# Patient Record
Sex: Female | Born: 1975 | Race: White | Hispanic: No | Smoking: Current some day smoker
Health system: Southern US, Community
[De-identification: ages and names within clinical notes are randomized; demographics above are authoritative.]

## PROBLEM LIST (undated history)

## (undated) DIAGNOSIS — A5901 Trichomonal vulvovaginitis: Secondary | ICD-10-CM

## (undated) DIAGNOSIS — J22 Unspecified acute lower respiratory infection: Secondary | ICD-10-CM

## (undated) DIAGNOSIS — N731 Chronic parametritis and pelvic cellulitis: Secondary | ICD-10-CM

## (undated) DIAGNOSIS — A749 Chlamydial infection, unspecified: Secondary | ICD-10-CM

## (undated) DIAGNOSIS — L0390 Cellulitis, unspecified: Secondary | ICD-10-CM

## (undated) HISTORY — DX: Chronic parametritis and pelvic cellulitis: N73.1

---
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
## 2003-11-11 ENCOUNTER — Emergency Department: Payer: Self-pay | Admitting: Emergency Medicine

## 2004-03-06 ENCOUNTER — Emergency Department: Payer: Self-pay | Admitting: Emergency Medicine

## 2004-12-12 ENCOUNTER — Emergency Department: Payer: Self-pay | Admitting: Emergency Medicine

## 2005-03-09 ENCOUNTER — Emergency Department: Payer: Self-pay | Admitting: Emergency Medicine

## 2005-04-14 ENCOUNTER — Emergency Department: Payer: Self-pay | Admitting: General Practice

## 2006-03-21 ENCOUNTER — Emergency Department: Payer: Self-pay | Admitting: Internal Medicine

## 2007-04-13 ENCOUNTER — Emergency Department: Payer: Self-pay | Admitting: Emergency Medicine

## 2007-09-08 ENCOUNTER — Emergency Department (HOSPITAL_COMMUNITY): Admission: EM | Admit: 2007-09-08 | Discharge: 2007-09-08 | Payer: Self-pay | Admitting: Emergency Medicine

## 2007-09-09 ENCOUNTER — Emergency Department: Payer: Self-pay | Admitting: Emergency Medicine

## 2008-01-09 ENCOUNTER — Emergency Department (HOSPITAL_COMMUNITY): Admission: EM | Admit: 2008-01-09 | Discharge: 2008-01-09 | Payer: Self-pay | Admitting: Emergency Medicine

## 2010-07-01 ENCOUNTER — Emergency Department: Payer: Self-pay | Admitting: Internal Medicine

## 2010-10-16 LAB — URINALYSIS, ROUTINE W REFLEX MICROSCOPIC
Bilirubin Urine: NEGATIVE
Ketones, ur: NEGATIVE mg/dL
Nitrite: NEGATIVE
Urobilinogen, UA: 0.2 mg/dL (ref 0.0–1.0)

## 2010-10-16 LAB — WET PREP, GENITAL: Trich, Wet Prep: NONE SEEN

## 2010-10-16 LAB — PREGNANCY, URINE: Preg Test, Ur: NEGATIVE

## 2010-10-16 LAB — GC/CHLAMYDIA PROBE AMP, GENITAL: Chlamydia, DNA Probe: NEGATIVE

## 2010-10-16 LAB — URINE MICROSCOPIC-ADD ON

## 2012-09-21 ENCOUNTER — Emergency Department: Payer: Self-pay | Admitting: Internal Medicine

## 2014-12-11 ENCOUNTER — Encounter: Payer: Self-pay | Admitting: *Deleted

## 2014-12-11 ENCOUNTER — Emergency Department
Admission: EM | Admit: 2014-12-11 | Discharge: 2014-12-11 | Disposition: A | Discharge status: Home / Self Care | Payer: BLUE CROSS/BLUE SHIELD | Attending: Emergency Medicine | Admitting: Emergency Medicine

## 2014-12-11 DIAGNOSIS — L03116 Cellulitis of left lower limb: Secondary | ICD-10-CM | POA: Insufficient documentation

## 2014-12-11 DIAGNOSIS — F17200 Nicotine dependence, unspecified, uncomplicated: Secondary | ICD-10-CM | POA: Diagnosis not present

## 2014-12-11 DIAGNOSIS — M79672 Pain in left foot: Secondary | ICD-10-CM | POA: Diagnosis present

## 2014-12-11 MED ORDER — SULFAMETHOXAZOLE-TRIMETHOPRIM 800-160 MG PO TABS: 1.0000 | ORAL_TABLET | Freq: Two times a day (BID) | ORAL | Status: DC

## 2014-12-11 NOTE — ED Provider Notes (Signed)
[facility] Emergency Department Provider Note ____________________________________________  Time seen: 1429  I have reviewed the triage vital signs and the nursing notes.  HISTORY  Chief Complaint  Foot Pain  HPI Julie Cooley is a 39 y.o. female reports to the ED for evaluation of pain to the left foot without known injury. She describes pain to the plantar surface between the third and fourth toes, at the balls of the toes. She denies any known injury, infection, laceration, or skin irritation. She's been dosing ibuprofen without significant tenderness symptoms.  History reviewed. No pertinent past medical history.  There are no active problems to display for this patient.  History reviewed. No pertinent past surgical history.  Current Outpatient Rx  Name  Route  Sig  Dispense  Refill  . sulfamethoxazole-trimethoprim (BACTRIM DS,SEPTRA DS) 800-160 MG tablet   Oral   Take 1 tablet by mouth 2 (two) times daily.   20 tablet   0     Allergies Review of patient's allergies indicates no known allergies.  No family history on file.  Social History Social History  Substance Use Topics  . Smoking status: Current Some Day Smoker  . Smokeless tobacco: None  . Alcohol Use: No   Review of Systems  Constitutional: Negative for fever. Eyes: Negative for visual changes. ENT: Negative for sore throat. Cardiovascular: Negative for chest pain. Respiratory: Negative for shortness of breath. Gastrointestinal: Negative for abdominal pain, vomiting and diarrhea. Genitourinary: Negative for dysuria. Musculoskeletal: Negative for back pain. Skin: Negative for rash. Left foot redness as above. Neurological: Negative for headaches, focal weakness or numbness. ____________________________________________  PHYSICAL EXAM:  VITAL SIGNS: ED Triage Vitals  Enc Vitals Group     BP 12/11/14 1407 143/64 mmHg     Pulse Rate 12/11/14 1407 84     Resp 12/11/14 1407  20     Temp 12/11/14 1407 98 F (36.7 C)     Temp Source 12/11/14 1407 Oral     SpO2 12/11/14 1407 97 %     Weight 12/11/14 1407 140 lb (63.504 kg)     Height 12/11/14 1407 5\' 4"  (1.626 m)     Head Cir --      Peak Flow --      Pain Score 12/11/14 1404 7     Pain Loc --      Pain Edu? --      Excl. in [place]? --    Constitutional: Alert and oriented. Well appearing and in no distress. Head: Normocephalic and atraumatic.      Eyes: Conjunctivae are normal. PERRL. Normal extraocular movements      Ears: Canals clear. TMs intact bilaterally.   Nose: No congestion/rhinorrhea.   Mouth/Throat: Mucous membranes are moist.   Neck: Supple. No thyromegaly. Hematological/Lymphatic/Immunological: No cervical lymphadenopathy. Cardiovascular: Normal rate, regular rhythm.  Respiratory: Normal respiratory effort. No wheezes/rales/rhonchi. Gastrointestinal: Soft and nontender. No distention. Musculoskeletal: Nontender with normal range of motion in all extremities.  Neurologic:  Normal gait without ataxia. Normal speech and language. No gross focal neurologic deficits are appreciated. Skin:  Skin is warm, dry and intact. No rash noted. Left foot with plantar surface erythema and edema to the MTPs of the 3rd & 4th digits. No fluctuance, pointing, or central lesion noted.  Psychiatric: Mood and affect are normal. Patient exhibits appropriate insight and judgment. ____________________________________________  INITIAL IMPRESSION / ASSESSMENT AND PLAN / ED COURSE  Left plantar surface cellulitis. Will treat with Bactrim DS as directed. Patient is  encouraged to soak the foot in warm water and epsom salt. Rest with the foot elevated and follow-up with Dr. Elvina Mattes for wound check. Return to the ED for worsening symptoms. ____________________________________________  FINAL CLINICAL IMPRESSION(S) / ED DIAGNOSES  Final diagnoses:  Cellulitis of foot, left      Melvenia Needles,  PA-C 12/11/14 1641  Daymon Larsen, MD 12/13/14 2306

## 2014-12-11 NOTE — Progress Notes (Signed)
Pt c/o left foot pain times several days. She denies any injury. Pt appears to have a small bump under her left fourth toe which is painful to the touch. Pt does have a positive pedal pulse.

## 2014-12-11 NOTE — ED Notes (Signed)
Pt reports right foot pain, pt denies injury to foot

## 2014-12-11 NOTE — Discharge Instructions (Signed)
Cellulitis Cellulitis is an infection of the skin and the tissue beneath it. The infected area is usually red and tender. Cellulitis occurs most often in the arms and lower legs.  CAUSES  Cellulitis is caused by bacteria that enter the skin through cracks or cuts in the skin. The most common types of bacteria that cause cellulitis are staphylococci and streptococci. SIGNS AND SYMPTOMS   Redness and warmth.  Swelling.  Tenderness or pain.  Fever. DIAGNOSIS  Your health care provider can usually determine what is wrong based on a physical exam. Blood tests may also be done. TREATMENT  Treatment usually involves taking an antibiotic medicine. HOME CARE INSTRUCTIONS   Take your antibiotic medicine as directed by your health care provider. Finish the antibiotic even if you start to feel better.  Keep the infected arm or leg elevated to reduce swelling.  Apply a warm cloth to the affected area up to 4 times per day to relieve pain.  Take medicines only as directed by your health care provider.  Keep all follow-up visits as directed by your health care provider. SEEK MEDICAL CARE IF:   You notice red streaks coming from the infected area.  Your red area gets larger or turns dark in color.  Your bone or joint underneath the infected area becomes painful after the skin has healed.  Your infection returns in the same area or another area.  You notice a swollen bump in the infected area.  You develop new symptoms.  You have a fever. SEEK IMMEDIATE MEDICAL CARE IF:   You feel very sleepy.  You develop vomiting or diarrhea.  You have a general ill feeling (malaise) with muscle aches and pains.   This information is not intended to replace advice given to you by your health care provider. Make sure you discuss any questions you have with your health care provider.   Document Released: 10/07/2004 Document Revised: 09/18/2014 Document Reviewed: 03/15/2011 Elsevier Interactive  Patient Education 2016 [place] the prescription antibiotic as directed until completely gone. Follow-up with Dr. Elvina Mattes for wound check and follow-up. Rest with the foot elevated when seated. Soak in warm, epsom salt water as needed.

## 2014-12-15 ENCOUNTER — Emergency Department (HOSPITAL_COMMUNITY)
Admission: EM | Admit: 2014-12-15 | Discharge: 2014-12-15 | Disposition: A | Discharge status: Home / Self Care | Payer: BLUE CROSS/BLUE SHIELD | Attending: Physician Assistant | Admitting: Physician Assistant

## 2014-12-15 ENCOUNTER — Encounter (HOSPITAL_COMMUNITY): Payer: Self-pay | Admitting: Emergency Medicine

## 2014-12-15 DIAGNOSIS — Z792 Long term (current) use of antibiotics: Secondary | ICD-10-CM | POA: Diagnosis not present

## 2014-12-15 DIAGNOSIS — L0291 Cutaneous abscess, unspecified: Secondary | ICD-10-CM

## 2014-12-15 DIAGNOSIS — R05 Cough: Secondary | ICD-10-CM | POA: Diagnosis not present

## 2014-12-15 DIAGNOSIS — F17200 Nicotine dependence, unspecified, uncomplicated: Secondary | ICD-10-CM | POA: Insufficient documentation

## 2014-12-15 DIAGNOSIS — L02612 Cutaneous abscess of left foot: Secondary | ICD-10-CM | POA: Diagnosis not present

## 2014-12-15 DIAGNOSIS — R509 Fever, unspecified: Secondary | ICD-10-CM | POA: Diagnosis present

## 2014-12-15 DIAGNOSIS — R110 Nausea: Secondary | ICD-10-CM | POA: Diagnosis not present

## 2014-12-15 MED ORDER — HYDROCODONE-ACETAMINOPHEN 5-325 MG PO TABS
1.0000 | ORAL_TABLET | Freq: Once | ORAL | Status: AC
Start: 2014-12-15 — End: 2014-12-15
  Administered 2014-12-15: 1 via ORAL
  Filled 2014-12-15: qty 1

## 2014-12-15 MED ORDER — HYDROCODONE-ACETAMINOPHEN 5-325 MG PO TABS: 2.0000 | ORAL_TABLET | ORAL | Status: DC | PRN

## 2014-12-15 MED ORDER — ONDANSETRON 4 MG PO TBDP
4.0000 mg | ORAL_TABLET | Freq: Three times a day (TID) | ORAL | Status: DC | PRN
Start: 2014-12-15 — End: 2016-12-09

## 2014-12-15 NOTE — ED Notes (Signed)
Pt. Stated, i went to [facility] for cellulitis in her left foot given Bactrim and started getting sick on Thursday with chills fever nausea

## 2014-12-15 NOTE — Discharge Instructions (Signed)
Take your medications as prescribed as needed. Keep area clean, with soap and water, and dry. Follow up at your scheduled appointment next week with wound care. Please return to the Emergency Department if symptoms worsen or new onset of fever, redness, swelling, warmth, drainage, numbness, tingling, weakness.   Emergency Department Resource Guide 1) Find a Doctor and Pay Out of Pocket Although you won't have to find out who is covered by your insurance plan, it is a good idea to ask around and get recommendations. You will then need to call the office and see if the doctor you have chosen will accept you as a new patient and what types of options they offer for patients who are self-pay. Some doctors offer discounts or will set up payment plans for their patients who do not have insurance, but you will need to ask so you aren't surprised when you get to your appointment.  2) Contact Your Local Health Department Not all health departments have doctors that can see patients for sick visits, but many do, so it is worth a call to see if yours does. If you don't know where your local health department is, you can check in your phone book. The CDC also has a tool to help you locate your state's health department, and many state websites also have listings of all of their local health departments.  3) Find a [facility] If your illness is not likely to be very severe or complicated, you may want to try a walk in clinic. These are popping up all over the country in pharmacies, drugstores, and shopping centers. They're usually staffed by nurse practitioners or physician assistants that have been trained to treat common illnesses and complaints. They're usually fairly quick and inexpensive. However, if you have serious medical issues or chronic medical problems, these are probably not your best option.  No Primary Care Doctor: - Call Health Connect at  [phone] - they can help you locate a primary care  doctor that  accepts your insurance, provides certain services, etc. - Physician Referral Service- [phone]  Chronic Pain Problems: Organization         Address  Phone   Notes  [facility]  [phone] Patients need to be referred by their primary care doctor.   Medication Assistance: Organization         Address  Phone   Notes  [facility] [place] [facility] Medication [facility] [address] [place], [place] [address] [phone] --Must be a resident of [facility] -- Must have NO insurance coverage whatsoever (no Medicaid/ Medicare, etc.) -- The pt. MUST have a primary care doctor that directs their care regularly and follows them in the community   Med[facility]ist  [phone]   [employer]  [phone]    Agencies that provide inexpensive medical care: Organization         Address  Phone   Notes  [place]  [phone]   [facility]    [phone]   [facility] [place], [place] [address] [phone]   [place] [address], [place] [phone]   Planned Parenthood    [phone]   [facility]    [phone]   [place] and [address], [place] Phone:  [phone], Fax:  [phone] Hours of Operation:  9 am -  6 pm, M-F.  Also accepts Medicaid/Medicare and self-pay.  [facility] for [place], [address], [place] Phone: [phone], Fax: [phone]. Hours of Operation:  8:30 am - 5:30 pm, M-F.  Also accepts Medicaid and self-pay.  [facility] [place] [address] [place] Phone: [phone]   [place], [place], [place] [phone], Ext. [phone] Mondays & Thursdays: 7-9 AM.  First 15 patients are seen on a first come, first serve basis.    [place]  Providers:  Organization         Address  Phone   Notes  [facility] [address],  [phone] Also accepts self-pay patients.  [facility] [id number] [place], [place]  [phone]   [place], [address], [place] [phone]   [facility] [address], [place] [phone]   Lucianne Lei [address], [place]   [phone] Only accepts [place] Access [place] patients after they have their name applied to their card.   Self-Pay (no insurance) in [facility]:  Organization         Address  Phone   Notes  Sickle Cell Patients, [facility] [place] [phone]   [facility] [phone]   [facility] [place]  [place], [place], [place] [phone]   [facility]/Dr. Osei-Bonsu  [address], [place] or [address], [place] [phone] Phone number for both [place] and [place] locations is the same.  [facility] and [facility] [address] [place] [phone]   [facility] [address] [place] or [address] [phone]   [facility] [address] [place] [phone], phone; [phone], fax Sees patients 1st and 3rd Saturday of every month.  Must not qualify for public or private insurance (i.e. Medicaid, Medicare, [place] Health Choice, Veterans' Benefits)  Household income should be no more than 200% of the poverty level The clinic cannot treat you if you are pregnant or think you are pregnant  Sexually transmitted diseases are not treated at the clinic.    Dental Care: Organization         Address  Phone  Notes  [facility] Department of [facility] [phone] Accepts children up to age 12 who are enrolled in [place] or [place]; pregnant women with a Medicaid card; and children who have applied for Medicaid or [place] Health Choice, but were declined, whose parents can pay a reduced fee at time of service.  [facility] Department of [facility]  [address], [place] [phone] Accepts children up to age 3 who are enrolled in [place] or [place]; pregnant women with a Medicaid card; and children who have applied for Medicaid or [place] Health Choice, but were declined, whose parents can pay a reduced fee at time of service.  [facility] Access PROGRAM  [place] [phone] Patients are seen by appointment only. Walk-ins are not accepted. [place] will see patients 73 years of age and  older. Monday - Tuesday (8am-5pm) Most Wednesdays (8:30-5pm) $30 per visit, cash only  [facility] Adult [employer] PROGRAM  [address], [facility] [phone] Patients are seen by appointment only. Walk-ins are not accepted. [place] will see patients 36 years of age and older. One Wednesday Evening (Monthly: Volunteer Based).  $30 per visit, cash only  [place]  [phone] for adults; Children under age 34, call Graduate Pediatric Dentistry at [phone]. Children aged 22-14, please call [phone] to request a pediatric application.  Dental services are provided in all areas of dental care including fillings, crowns and bridges, complete and partial dentures, implants, gum treatment, root canals, and extractions. Preventive care is also provided. Treatment is provided to both adults and children. Patients are selected via a lottery and there is often a waiting list.   [facility] [address] [place]  [phone] [email or web address]   [facility]  [address] [place], [place] [phone], Ext. [phone] Second and Fourth Thursday of each month, opens at 6:30 AM; Clinic ends at 9 AM.  Patients are seen on a first-come first-served basis, and a limited number are seen during each clinic.   [facility]  [address] Hillard Danker [place], [place] [phone]   Eligibility Requirements You must have lived in [place], [place], or [place] counties for at least the last three months.   You cannot be eligible for state or federal sponsored [employer], including [employer], [place], or [employer].   You generally cannot be eligible for healthcare insurance through your employer.    How to apply: Eligibility screenings are held every Tuesday and Wednesday afternoon from 1:00 pm until 4:00 pm. You do not need an appointment for the interview!  [facility] [address] [place], [place]   [place]  [place] Department  [place]  [phone]    Behavioral Health Resources in the Community: Intensive Outpatient Programs Organization         Address  Phone  Notes  [place]. [address], [place], [place] [phone]   [facility]patient [address] [place], [place]   ADS: Alcohol & Drug Svcs [address], [place], [place]   [place] [address],  [place], [place] or [phone]   Substance Abuse Resources Organization         Address  Phone  Notes  Alcohol and Drug Services  [phone]   [place]  [phone]   [facility] [place]   Chinita Pester  [phone]   Residential & Outpatient Substance Abuse Program  [phone]   Psychological Services Organization         Address  Phone  Notes  [facility]  [place]  [phone]   [place] [address] [place] [phone] or [phone]    Mobile Crisis Teams Organization         Address  Phone  Notes  [facility], Mobile Crisis Care Unit  [phone]   [facility]ive Psychotherapeutic Services  [address] [place], [place]   [facility] [address], [place] [phone]    Self-Help/Support Groups Organization         Address  Phone             Notes  Mental Health Assoc. of [place] - variety of support groups  [place] Call for more information  Narcotics Anonymous (NA), [facility] [address], [employer] [place]  2 meetings at this location   Special educational needs teacher         Address  Phone  Notes  [facility],    [place]  [phone]-([phone]   [facility]  [address], [place] [id number], [place], [place]   Agency [place], [place] [phone] Admissions: 8am-3pm M-F  Incentives Substance [place] [address],    [place], [place] [phone]   [facility] [address] [place], [place], [place]   [facility] [address]  [place], [place]   [facility]ight Programs - Intensive Outpatient [address] Kristeen [address], [place], [place]   [facility] ([place] [address].,  [place], [place] [phone] or [phone]   [facility]idential Treatment Services (RTS) [address], [place], [place] Accepts Medicaid  [facility] [place] [address],  [place] [phone] Substance Abuse/Addiction Treatment   [facility] Organization         Address  Phone  Notes  [facility]  [phone]   Domenic Schwab, PhD [address] [place], [place]   [phone] or [phone]    [place], [place] [phone]   [facility] [address] [place], [place] [phone] Insurance/Medicaid/sponsorship through [facility] and Families [address] [place]                                    [place], [place] [phone] [place] [address], [place] [phone]    Dr. Adele Schilder  [phone]   [facility] Dept. 1) [address] [place] 2) [place] 3)  [place] [address], [place] [phone]  [phone]   [place] [phone] or [phone] (After Hours)

## 2014-12-15 NOTE — ED Notes (Signed)
PA at the bedside.

## 2014-12-15 NOTE — ED Provider Notes (Signed)
CSN: [id number]     Arrival date & time 12/15/14  0827 History   First MD Initiated Contact with Patient 12/15/14 0848     Chief Complaint  Patient presents with  . Chills  . Fever     (Consider location/radiation/quality/duration/timing/severity/associated sxs/prior Treatment) HPI   Patient is a 39 year old female who presents to the ED with complaint of left foot pain. Patient reports having pain to the plantar surface of her fourth toe, at the ball of her foot. Denies any recent fall, trauma or injury. Denies any known puncture wound. Patient reports initially having swelling and redness extending on the dorsal aspect of her midfoot. She notes she was seen at [facility] ED on 11/30, diagnosed with cellulitis and given Bactrim. She notes she has been taking the antibiotic as prescribed and states the redness has resolved and swelling has improved, however she notes she feels that the pus has now come to the surface, denies any drainage. She has also been taking ibuprofen and reports mild pain relief. Patient also reports over the past 2 days she has had generalized malaise, chills and intermittent nausea. Patient reports taking ibuprofen on an empty stomach resulting in her feeling nauseous. Denies fever, headache, cough, SOB, CP, abdominal pain, vomiting, diarrhea, urinary sxs, numbness tingling weakness.  History reviewed. No pertinent past medical history. History reviewed. No pertinent past surgical history. No family history on file. Social History  Substance Use Topics  . Smoking status: Current Some Day Smoker  . Smokeless tobacco: None  . Alcohol Use: No   OB History    No data available     Review of Systems  Constitutional: Positive for chills.  Respiratory: Positive for cough.   Gastrointestinal: Positive for nausea.  Skin: Positive for wound.  All other systems reviewed and are negative.     Allergies  Review of patient's allergies indicates no known  allergies.  Home Medications   Prior to Admission medications   Medication Sig Start Date End Date Taking? Authorizing Provider  HYDROcodone-acetaminophen (NORCO/VICODIN) 5-325 MG tablet Take 2 tablets by mouth every 4 (four) hours as needed. 12/15/14   Nona Dell, PA-C  ondansetron (ZOFRAN ODT) 4 MG disintegrating tablet Take 1 tablet (4 mg total) by mouth every 8 (eight) hours as needed for nausea or vomiting. 12/15/14   Nona Dell, PA-C  sulfamethoxazole-trimethoprim (BACTRIM DS,SEPTRA DS) 800-160 MG tablet Take 1 tablet by mouth 2 (two) times daily. 12/11/14   Jenise V Bacon Menshew, PA-C   BP 111/74 mmHg  Pulse 72  Temp(Src) 97.7 F (36.5 C)  Resp 18  Ht 5\' 3"  (1.6 m)  Wt 63.504 kg  BMI 24.81 kg/m2  SpO2 98%  LMP 12/04/2014 Physical Exam  Constitutional: She is oriented to person, place, and time. She appears well-developed and well-nourished. No distress.  HENT:  Head: Normocephalic and atraumatic.  Mouth/Throat: Oropharynx is clear and moist. No oropharyngeal exudate.  Eyes: Conjunctivae and EOM are normal. Right eye exhibits no discharge. Left eye exhibits no discharge. No scleral icterus.  Neck: Normal range of motion. Neck supple.  Cardiovascular: Normal rate, regular rhythm, normal heart sounds and intact distal pulses.   Pulmonary/Chest: Effort normal and breath sounds normal. No respiratory distress. She has no wheezes. She has no rales. She exhibits no tenderness.  Abdominal: Soft. Bowel sounds are normal. She exhibits no distension and no mass. There is no tenderness. There is no rebound and no guarding.  Musculoskeletal: Normal range of motion. She exhibits no  edema.       Left foot: There is tenderness and swelling. There is normal range of motion, no bony tenderness, normal capillary refill, no crepitus, no deformity and no laceration.  0.5cm area of fluctuance with mild surrounding erythema and swelling to plantar surface of left 4th digit at  crease of MTP joint. Small pustule noted. No drainage. FROM of left toes and foot, sensation intact, 2+ PT pulses, cap refill <2, 5/5 strength.   Lymphadenopathy:    She has no cervical adenopathy.  Neurological: She is alert and oriented to person, place, and time.  Skin: Skin is warm and dry.  Nursing note and vitals reviewed.   ED Course  .Marland KitchenIncision and Drainage Date/Time: 12/15/2014 10:06 AM Performed by: Nona Dell Authorized by: Nona Dell Consent: Verbal consent obtained. Risks and benefits: risks, benefits and alternatives were discussed Consent given by: patient Patient understanding: patient states understanding of the procedure being performed Type: abscess Body area: lower extremity Location details: left fourth toe Local anesthetic: lidocaine spray Scalpel size: 11 Incision type: single straight Incision depth: dermal Complexity: simple Drainage: purulent Drainage amount: small. Patient tolerance: Patient tolerated the procedure well with no immediate complications   (including critical care time) Labs Review Labs Reviewed - No data to display  Imaging Review No results found. I have personally reviewed and evaluated these images and lab results as part of my medical decision-making.  Filed Vitals:   12/15/14 1055 12/15/14 1115  BP: 115/80 111/74  Pulse: 78 72  Temp:    Resp: 18      MDM   Final diagnoses:  Abscess    Patient presents with left foot pain. She was seen at [facility] ED on 12/2, diagnosed with cellulitis and started on Bactrim. She notes her redness has significantly improved since starting the antibiotic. Patient reports she has been taking ibuprofen for pain with mild relief but notes she took the medication on an empty stomach and reports associated nausea. VSS. Exam revealed small area of fluctuance with mild amount of surrounding erythema and swelling to plantar surface of left foot, no drainage. Left foot  neurovascularly intact. I&D performed without any complications. Plan to discharge patient home. Advised patient to continue taking her antibiotics as prescribed and discussed wound care. Pt advised to follow up at her scheduled appointment next week for follow up.  Evaluation does not show pathology requring ongoing emergent intervention or admission. Pt is hemodynamically stable and mentating appropriately. Discussed findings/results and plan with patient/guardian, who agrees with plan. All questions answered. Return precautions discussed and outpatient follow up given.       Chesley Noon [place], [place] 12/15/14 [place], MD 12/15/14 1556

## 2016-12-04 ENCOUNTER — Emergency Department (HOSPITAL_COMMUNITY): Payer: Self-pay

## 2016-12-04 ENCOUNTER — Emergency Department (HOSPITAL_COMMUNITY)
Admission: EM | Admit: 2016-12-04 | Discharge: 2016-12-04 | Disposition: A | Discharge status: Home / Self Care | Payer: Self-pay | Attending: Emergency Medicine | Admitting: Emergency Medicine

## 2016-12-04 ENCOUNTER — Encounter (HOSPITAL_COMMUNITY): Payer: Self-pay

## 2016-12-04 ENCOUNTER — Other Ambulatory Visit: Payer: Self-pay

## 2016-12-04 DIAGNOSIS — R10813 Right lower quadrant abdominal tenderness: Secondary | ICD-10-CM | POA: Insufficient documentation

## 2016-12-04 DIAGNOSIS — R110 Nausea: Secondary | ICD-10-CM | POA: Insufficient documentation

## 2016-12-04 DIAGNOSIS — F17200 Nicotine dependence, unspecified, uncomplicated: Secondary | ICD-10-CM | POA: Insufficient documentation

## 2016-12-04 DIAGNOSIS — R1900 Intra-abdominal and pelvic swelling, mass and lump, unspecified site: Secondary | ICD-10-CM | POA: Insufficient documentation

## 2016-12-04 DIAGNOSIS — N838 Other noninflammatory disorders of ovary, fallopian tube and broad ligament: Secondary | ICD-10-CM

## 2016-12-04 DIAGNOSIS — R102 Pelvic and perineal pain: Secondary | ICD-10-CM | POA: Insufficient documentation

## 2016-12-04 DIAGNOSIS — R300 Dysuria: Secondary | ICD-10-CM | POA: Insufficient documentation

## 2016-12-04 DIAGNOSIS — Z7982 Long term (current) use of aspirin: Secondary | ICD-10-CM | POA: Insufficient documentation

## 2016-12-04 DIAGNOSIS — N839 Noninflammatory disorder of ovary, fallopian tube and broad ligament, unspecified: Secondary | ICD-10-CM | POA: Insufficient documentation

## 2016-12-04 DIAGNOSIS — Z79899 Other long term (current) drug therapy: Secondary | ICD-10-CM | POA: Insufficient documentation

## 2016-12-04 LAB — COMPREHENSIVE METABOLIC PANEL
ALT: 18 U/L (ref 14–54)
AST: 17 U/L (ref 15–41)
Albumin: 3.7 g/dL (ref 3.5–5.0)
Alkaline Phosphatase: 37 U/L — ABNORMAL LOW (ref 38–126)
Anion gap: 7 (ref 5–15)
BUN: 9 mg/dL (ref 6–20)
CO2: 25 mmol/L (ref 22–32)
Calcium: 8.5 mg/dL — ABNORMAL LOW (ref 8.9–10.3)
Chloride: 105 mmol/L (ref 101–111)
Creatinine, Ser: 0.95 mg/dL (ref 0.44–1.00)
GFR calc Af Amer: >60 mL/min (ref >60)
GFR calc non Af Amer: >60 mL/min (ref >60)
Glucose, Bld: 119 mg/dL — ABNORMAL HIGH (ref 65–99)
Potassium: 4.3 mmol/L (ref 3.5–5.1)
Sodium: 137 mmol/L (ref 135–145)
Total Bilirubin: 0.6 mg/dL (ref 0.3–1.2)
Total Protein: 6.1 g/dL — ABNORMAL LOW (ref 6.5–8.1)

## 2016-12-04 LAB — CBC
HCT: 46.1 % — ABNORMAL HIGH (ref 36.0–46.0)
Hemoglobin: 15.3 g/dL — ABNORMAL HIGH (ref 12.0–15.0)
MCH: 31 pg (ref 26.0–34.0)
MCHC: 33.2 g/dL (ref 30.0–36.0)
MCV: 93.5 fL (ref 78.0–100.0)
Platelets: 379 10*3/uL (ref 150–400)
RBC: 4.93 MIL/uL (ref 3.87–5.11)
RDW: 13.6 % (ref 11.5–15.5)
WBC: 9.8 10*3/uL (ref 4.0–10.5)

## 2016-12-04 LAB — URINALYSIS, ROUTINE W REFLEX MICROSCOPIC
Bilirubin Urine: NEGATIVE
Glucose, UA: NEGATIVE mg/dL
Hgb urine dipstick: NEGATIVE
Ketones, ur: NEGATIVE mg/dL
Leukocytes, UA: NEGATIVE
Nitrite: NEGATIVE
Protein, ur: NEGATIVE mg/dL
Specific Gravity, Urine: 1.001 — ABNORMAL LOW (ref 1.005–1.030)
pH: 6 (ref 5.0–8.0)

## 2016-12-04 LAB — I-STAT BETA HCG BLOOD, ED (MC, WL, AP ONLY): I-stat hCG, quantitative: <5 m[IU]/mL (ref <5)

## 2016-12-04 LAB — LIPASE, BLOOD: Lipase: 23 U/L (ref 11–51)

## 2016-12-04 MED ORDER — IOPAMIDOL (ISOVUE-300) INJECTION 61%
INTRAVENOUS | Status: AC
  Administered 2016-12-04: 100 mL
  Filled 2016-12-04: qty 100

## 2016-12-04 NOTE — ED Notes (Signed)
Family sitting in the room waiting for the pt to return from [place]

## 2016-12-04 NOTE — ED Provider Notes (Signed)
[facility] EMERGENCY DEPARTMENT Provider Note   CSN: [id number] Arrival date & time: 12/04/16  1004     History   Chief Complaint Chief Complaint  Patient presents with  . Abdominal Pain    HPI Julie Cooley is a 41 y.o. female.  HPI     41 year old female presents today with complaints of dysuria and flank pain.  Patient notes that approximately 3 days ago she developed pain to her right flank.  She notes that this pain was accompanied by a burning sensation with her urinations.  She notes that the pain is radiated down into her pelvis.  She denies any vomiting reports some minor nausea.  She denies any fever, denies any other abdominal pain, denies any history of the same.  She notes that her urine has been more cloudy than normal.  History reviewed. No pertinent past medical history.  There are no active problems to display for this patient.   History reviewed. No pertinent surgical history.  OB History    No data available       Home Medications    Prior to Admission medications   Medication Sig Start Date End Date Taking? Authorizing Provider  aspirin EC 81 MG tablet Take 81 mg by mouth daily.   Yes [provider]  HYDROcodone-acetaminophen (NORCO/VICODIN) 5-325 MG tablet Take 2 tablets by mouth every 4 (four) hours as needed. Patient not taking: Reported on 12/04/2016 12/15/14   Nona Dell, PA-C  ondansetron (ZOFRAN ODT) 4 MG disintegrating tablet Take 1 tablet (4 mg total) by mouth every 8 (eight) hours as needed for nausea or vomiting. Patient not taking: Reported on 12/04/2016 12/15/14   Nona Dell, PA-C    Family History No family history on file.  Social History Social History   Tobacco Use  . Smoking status: Current Some Day Smoker  . Smokeless tobacco: Never Used  Substance Use Topics  . Alcohol use: No  . Drug use: Not on file     Allergies   Patient has no known  allergies.   Review of Systems Review of Systems  All other systems reviewed and are negative.  Physical Exam Updated Vital Signs BP 109/73   Pulse 72   Temp 99.2 F (37.3 C) (Oral)   Resp 16   LMP 11/20/2016   SpO2 99%   Physical Exam  Constitutional: She is oriented to person, place, and time. She appears well-developed and well-nourished.  HENT:  Head: Normocephalic and atraumatic.  Eyes: Conjunctivae are normal. Pupils are equal, round, and reactive to light. Right eye exhibits no discharge. Left eye exhibits no discharge. No scleral icterus.  Neck: Normal range of motion. No JVD present. No tracheal deviation present.  Pulmonary/Chest: Effort normal. No stridor.  Abdominal:  Minor tenderness palpation to the suprapubic and right pelvic/ RLQ -no CVA tenderness, upper abdomen soft nontender  Neurological: She is alert and oriented to person, place, and time. Coordination normal.  Skin: Skin is warm.  Psychiatric: She has a normal mood and affect. Her behavior is normal. Judgment and thought content normal.  Nursing note and vitals reviewed.    ED Treatments / Results  Labs (all labs ordered are listed, but only abnormal results are displayed) Labs Reviewed  COMPREHENSIVE METABOLIC PANEL - Abnormal; Notable for the following components:      Result Value   Glucose, Bld 119 (*)    Calcium 8.5 (*)    Total Protein 6.1 (*)  Alkaline Phosphatase 37 (*)    All other components within normal limits  CBC - Abnormal; Notable for the following components:   Hemoglobin 15.3 (*)    HCT 46.1 (*)    All other components within normal limits  URINALYSIS, ROUTINE W REFLEX MICROSCOPIC - Abnormal; Notable for the following components:   Color, Urine STRAW (*)    Specific Gravity, Urine 1.001 (*)    All other components within normal limits  LIPASE, BLOOD  I-STAT BETA HCG BLOOD, ED ([facility], [facility], [facility] ONLY)    EKG  EKG Interpretation None       Radiology Ct Abdomen Pelvis W  Contrast  Result Date: 12/04/2016 CLINICAL DATA:  Right flank pain 1 week ago, now located in the right lower quadrant. EXAM: CT ABDOMEN AND PELVIS WITH CONTRAST TECHNIQUE: Multidetector CT imaging of the abdomen and pelvis was performed using the standard protocol following bolus administration of intravenous contrast. CONTRAST:  120mL ISOVUE-300 IOPAMIDOL (ISOVUE-300) INJECTION 61% COMPARISON:  None. FINDINGS: Lower chest: Clear lung bases. Hepatobiliary: No focal liver abnormality is seen. No gallstones, gallbladder wall thickening, or biliary dilatation. Pancreas: Unremarkable. Spleen: Unremarkable. Adrenals/Urinary Tract: Unremarkable adrenal glands. Mild right hydronephrosis without ureteral dilatation or calculi identified. Unremarkable left kidney and bladder. Stomach/Bowel: The stomach is within normal limits. There is no evidence of bowel obstruction or gross bowel wall thickening. The appendix is unremarkable. Vascular/Lymphatic: Mild soft plaque in the distal abdominal aorta without aneurysm. No enlarged lymph nodes. Reproductive: Small to moderate volume free fluid in the pelvis asymmetric to the right. Enlarged right ovary with a 3.6 x 2.2 cm hypoattenuating/cystic focus with mild peripheral enhancement in the right adnexa and asymmetrically prominent right adnexal vascularity. Unremarkable uterus. No left adnexal mass. Other: No abdominal wall mass or hernia. Musculoskeletal: No acute osseous abnormality or suspicious osseous lesion. IMPRESSION: 1. Pelvic free fluid. Enlarged right ovary with underlying 3.6 cm low-density/cystic structure. Pelvic ultrasound is recommended for further evaluation, including Doppler to assess for torsion. 2. Mild right hydronephrosis. No obstructing stone or ureteral dilatation. This may be secondary to the right adnexal process. 3. Normal appendix. Electronically Signed   By: Logan Bores M.D.   On: 12/04/2016 12:56   US Pelvic Doppler (torsion R/o Or Mass Arterial  Flow)  Result Date: 12/04/2016 CLINICAL DATA:  Acute right lower quadrant abdominal pain. EXAM: TRANSABDOMINAL AND TRANSVAGINAL ULTRASOUND OF PELVIS DOPPLER ULTRASOUND OF OVARIES TECHNIQUE: Both transabdominal and transvaginal ultrasound examinations of the pelvis were performed. Transabdominal technique was performed for global imaging of the pelvis including uterus, ovaries, adnexal regions, and pelvic cul-de-sac. It was necessary to proceed with endovaginal exam following the transabdominal exam to visualize the endometrium and ovaries. Color and duplex Doppler ultrasound was utilized to evaluate blood flow to the ovaries. COMPARISON:  CT scan of same day. FINDINGS: Uterus Measurements: 6.8 x 4.5 x 3.7 cm. 1.2 cm fibroid is noted posteriorly. Endometrium Thickness: 6.6 mm which is within normal limits. No focal abnormality is noted. Right ovary Measurements: 4.7 x 4.1 x 2.4 cm. 3.1 x 2.9 x 1.2 cm complex but predominantly cystic structure is noted, with with multiple septations. Left ovary Surgically removed. Pulsed Doppler evaluation of right ovary demonstrates normal low-resistance arterial and venous waveforms. Other findings Mild amount of free fluid is noted which most likely is physiologic. IMPRESSION: Small uterine fibroid is noted. 3.1 x 2.9 x 1.2 cm complex predominantly cystic mass is noted in right ovary with multiple septations. 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 Consultation with gynecological surgery is recommended, or MRI may be performed further evaluation. Electronically Signed   By: Marijo Conception, M.D.   On: 12/04/2016 15:25   US Pelvic Complete With Transvaginal  Result Date: 12/04/2016 CLINICAL DATA:  Acute right lower quadrant abdominal pain. EXAM: TRANSABDOMINAL AND TRANSVAGINAL ULTRASOUND OF PELVIS DOPPLER ULTRASOUND OF OVARIES TECHNIQUE: Both transabdominal and transvaginal ultrasound examinations of the pelvis  were performed. Transabdominal technique was performed for global imaging of the pelvis including uterus, ovaries, adnexal regions, and pelvic cul-de-sac. It was necessary to proceed with endovaginal exam following the transabdominal exam to visualize the endometrium and ovaries. Color and duplex Doppler ultrasound was utilized to evaluate blood flow to the ovaries. COMPARISON:  CT scan of same day. FINDINGS: Uterus Measurements: 6.8 x 4.5 x 3.7 cm. 1.2 cm fibroid is noted posteriorly. Endometrium Thickness: 6.6 mm which is within normal limits. No focal abnormality is noted. Right ovary Measurements: 4.7 x 4.1 x 2.4 cm. 3.1 x 2.9 x 1.2 cm complex but predominantly cystic structure is noted, with with multiple septations. Left ovary Surgically removed. Pulsed Doppler evaluation of right ovary demonstrates normal low-resistance arterial and venous waveforms. Other findings Mild amount of free fluid is noted which most likely is physiologic. IMPRESSION: Small uterine fibroid is noted. 3.1 x 2.9 x 1.2 cm complex predominantly cystic mass is noted in right ovary with multiple septations. 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 Consultation with gynecological surgery is recommended, or MRI may be performed further evaluation. Electronically Signed   By: Marijo Conception, M.D.   On: 12/04/2016 15:25    Procedures Procedures (including critical care time)  Medications Ordered in ED Medications  iopamidol (ISOVUE-300) 61 % injection (100 mLs  Contrast Given 12/04/16 1208)     Initial Impression / Assessment and Plan / ED Course  I have reviewed the triage vital signs and the nursing notes.  Pertinent labs & imaging results that were available during my care of the patient were reviewed by me and considered in my medical decision making (see chart for details).    Final Clinical Impressions(s) / ED Diagnoses   Final diagnoses:  Pelvic mass   Ovarian mass    Labs:   Imaging: Urinalysis, stat beta hCG, lipase, CMP  Consults: Dr. Denman George  Therapeutics:  Discharge Meds:   Assessment/Plan: 41 year old female presents with abdominal pain.  Patient was noted to have an ovarian massThis appears to be benign but will need close follow-up, .  Patient also having mild right hydronephrosis, normal kidney function here.  I spoke with on-call OB/GYN oncologist Dr. Denman George recommends outpatient follow-up with her OB/GYN.  Patient does not have an OB/GYN, I had a lengthy discussion with the patient about following up at the [facility] next week.  Patient is comfortable at discharge, she does not want any pain medication, I gave her strict return precautions.  She will follow-up as instructed in a timely fashion and will return immediately if she has any new or worsening signs or symptoms.  She verbalized understanding and agreement to today's plan had no further questions or concerns at the time of discharge.    ED Discharge Orders    None       Francee Gentile 12/04/16 1638    Virgel Manifold, MD 12/05/16 1141

## 2016-12-04 NOTE — ED Notes (Signed)
The pt has no pain at present  She just returned from [place]

## 2016-12-04 NOTE — ED Notes (Signed)
Pt transported to CT/radiology on stretcher with tech.

## 2016-12-04 NOTE — Discharge Instructions (Signed)
Please read attached information. If you experience any new or worsening signs or symptoms please return to the emergency room for evaluation.  Please contact the women's outpatient clinic and schedule follow-up evaluation.  Please contact [facility] wellness for assistance with orange card.  Please use Tylenol or ibuprofen as needed for discomfort.

## 2016-12-04 NOTE — ED Triage Notes (Signed)
Patient complains of right sided abdominal pain with radiation across abdomen x 3 days. No nausea, no vomiting, no diarrhea. Also missed menstrual cycle the past month. States pain worse after intake

## 2016-12-04 NOTE — ED Notes (Signed)
Pt verbalized understanding of d/c instructions and has no further questions. VSS, NAD. Pt to follow up with women's outpatient clinic. Pt removed all belongings.

## 2016-12-04 NOTE — ED Notes (Signed)
Pt transported back to D31 on stretcher with tech, tolerated well.

## 2016-12-04 NOTE — ED Notes (Signed)
Pt transported to ultrasound via stretcher with tech. 

## 2016-12-09 ENCOUNTER — Encounter: Payer: Self-pay | Admitting: Obstetrics & Gynecology

## 2016-12-09 ENCOUNTER — Ambulatory Visit: Payer: BLUE CROSS/BLUE SHIELD | Admitting: Obstetrics & Gynecology

## 2016-12-09 VITALS — BP 156/75 | HR 72 | Wt 146.5 lb

## 2016-12-09 DIAGNOSIS — N83201 Unspecified ovarian cyst, right side: Secondary | ICD-10-CM | POA: Diagnosis not present

## 2016-12-09 NOTE — Progress Notes (Signed)
Pt given free pap screening info.  Mammogram scholarship faxed to the [facility].

## 2016-12-09 NOTE — Progress Notes (Signed)
GYNECOLOGY OFFICE VISIT NOTE  History:  41 y.o. F here today for discussion about management of recently diagnosed right ovarian complex cyst after going to ED for RLQ pain. She is accompanied by her husband. Reports rare episodes of pain, none currently, that radiate to her back.  Alleviated by pain medication. She denies any abnormal vaginal discharge, bleeding, or other concerns.   History reviewed. No pertinent past medical history.  History reviewed. No pertinent surgical history.  The following portions of the patient's history were reviewed and updated as appropriate: allergies, current medications, past family history, past medical history, past social history, past surgical history and problem list.   Health Maintenance:  Normal pap more than 2 years ago. Never had mammogram.   Review of Systems:  Pertinent items noted in HPI and remainder of comprehensive ROS otherwise negative.  Objective:  Physical Exam BP (!) 156/75   Pulse 72   Wt 146 lb 8 oz (66.5 kg)   LMP 11/20/2016   BMI 25.95 kg/m  CONSTITUTIONAL: Well-developed, well-nourished female in no acute distress.  HENT:  Normocephalic, atraumatic. External right and left ear normal. Oropharynx is clear and moist EYES: Conjunctivae and EOM are normal. Pupils are equal, round, and reactive to light. No scleral icterus.  NECK: Normal range of motion, supple, no masses SKIN: Skin is warm and dry. No rash noted. Not diaphoretic. No erythema. No pallor. NEUROLOGIC: Alert and oriented to person, place, and time. Normal reflexes, muscle tone coordination. No cranial nerve deficit noted. PSYCHIATRIC: Normal mood and affect. Normal behavior. Normal judgment and thought content. CARDIOVASCULAR: Normal heart rate noted RESPIRATORY: Effort and breath sounds normal, no problems with respiration noted ABDOMEN: Soft, nontender, no distention noted.   PELVIC: Deferred MUSCULOSKELETAL: Normal range of motion. No edema noted.  Labs  and Imaging Ct Abdomen Pelvis W Contrast  Result Date: 12/04/2016 CLINICAL DATA:  Right flank pain 1 week ago, now located in the right lower quadrant. EXAM: CT ABDOMEN AND PELVIS WITH CONTRAST TECHNIQUE: Multidetector CT imaging of the abdomen and pelvis was performed using the standard protocol following bolus administration of intravenous contrast. CONTRAST:  133mL ISOVUE-300 IOPAMIDOL (ISOVUE-300) INJECTION 61% COMPARISON:  None. FINDINGS: Lower chest: Clear lung bases. Hepatobiliary: No focal liver abnormality is seen. No gallstones, gallbladder wall thickening, or biliary dilatation. Pancreas: Unremarkable. Spleen: Unremarkable. Adrenals/Urinary Tract: Unremarkable adrenal glands. Mild right hydronephrosis without ureteral dilatation or calculi identified. Unremarkable left kidney and bladder. Stomach/Bowel: The stomach is within normal limits. There is no evidence of bowel obstruction or gross bowel wall thickening. The appendix is unremarkable. Vascular/Lymphatic: Mild soft plaque in the distal abdominal aorta without aneurysm. No enlarged lymph nodes. Reproductive: Small to moderate volume free fluid in the pelvis asymmetric to the right. Enlarged right ovary with a 3.6 x 2.2 cm hypoattenuating/cystic focus with mild peripheral enhancement in the right adnexa and asymmetrically prominent right adnexal vascularity. Unremarkable uterus. No left adnexal mass. Other: No abdominal wall mass or hernia. Musculoskeletal: No acute osseous abnormality or suspicious osseous lesion. IMPRESSION: 1. Pelvic free fluid. Enlarged right ovary with underlying 3.6 cm low-density/cystic structure. Pelvic ultrasound is recommended for further evaluation, including Doppler to assess for torsion. 2. Mild right hydronephrosis. No obstructing stone or ureteral dilatation. This may be secondary to the right adnexal process. 3. Normal appendix. Electronically Signed   By: Logan Bores M.D.   On: 12/04/2016 12:56   US Pelvic  Doppler (torsion R/o Or Mass Arterial Flow)  Result Date: 12/04/2016 CLINICAL DATA:  Acute right lower quadrant abdominal pain. EXAM: TRANSABDOMINAL AND TRANSVAGINAL ULTRASOUND OF PELVIS DOPPLER ULTRASOUND OF OVARIES TECHNIQUE: Both transabdominal and transvaginal ultrasound examinations of the pelvis were performed. Transabdominal technique was performed for global imaging of the pelvis including uterus, ovaries, adnexal regions, and pelvic cul-de-sac. It was necessary to proceed with endovaginal exam following the transabdominal exam to visualize the endometrium and ovaries. Color and duplex Doppler ultrasound was utilized to evaluate blood flow to the ovaries. COMPARISON:  CT scan of same day. FINDINGS: Uterus Measurements: 6.8 x 4.5 x 3.7 cm. 1.2 cm fibroid is noted posteriorly. Endometrium Thickness: 6.6 mm which is within normal limits. No focal abnormality is noted. Right ovary Measurements: 4.7 x 4.1 x 2.4 cm. 3.1 x 2.9 x 1.2 cm complex but predominantly cystic structure is noted, with with multiple septations. Left ovary Surgically removed. Pulsed Doppler evaluation of right ovary demonstrates normal low-resistance arterial and venous waveforms. Other findings Mild amount of free fluid is noted which most likely is physiologic. IMPRESSION: Small uterine fibroid is noted. 3.1 x 2.9 x 1.2 cm complex predominantly cystic mass is noted in right ovary with multiple septations. 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 Consultation with gynecological surgery is recommended, or MRI may be performed further evaluation. Electronically Signed   By: Marijo Conception, M.D.   On: 12/04/2016 15:25   US Pelvic Complete With Transvaginal  Result Date: 12/04/2016 CLINICAL DATA:  Acute right lower quadrant abdominal pain. EXAM: TRANSABDOMINAL AND TRANSVAGINAL ULTRASOUND OF PELVIS DOPPLER ULTRASOUND OF OVARIES TECHNIQUE: Both transabdominal and transvaginal  ultrasound examinations of the pelvis were performed. Transabdominal technique was performed for global imaging of the pelvis including uterus, ovaries, adnexal regions, and pelvic cul-de-sac. It was necessary to proceed with endovaginal exam following the transabdominal exam to visualize the endometrium and ovaries. Color and duplex Doppler ultrasound was utilized to evaluate blood flow to the ovaries. COMPARISON:  CT scan of same day. FINDINGS: Uterus Measurements: 6.8 x 4.5 x 3.7 cm. 1.2 cm fibroid is noted posteriorly. Endometrium Thickness: 6.6 mm which is within normal limits. No focal abnormality is noted. Right ovary Measurements: 4.7 x 4.1 x 2.4 cm. 3.1 x 2.9 x 1.2 cm complex but predominantly cystic structure is noted, with with multiple septations. Left ovary Surgically removed. Pulsed Doppler evaluation of right ovary demonstrates normal low-resistance arterial and venous waveforms. Other findings Mild amount of free fluid is noted which most likely is physiologic. IMPRESSION: Small uterine fibroid is noted. 3.1 x 2.9 x 1.2 cm complex predominantly cystic mass is noted in right ovary with multiple septations. 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 Consultation with gynecological surgery is recommended, or MRI may be performed further evaluation. Electronically Signed   By: Marijo Conception, M.D.   On: 12/04/2016 15:25    Assessment & Plan:  1. Complex cyst of right ovary Reviewed results of imaging studies in detail with patient; cyst is most likely benign. Patient is concerned about the pain/torsion, desires removal of the cyst/ovary.  She has a history of LSO in past also for complex cyst.  She was cautioned about risks of surgical menopause, possible need for HRT and increased risk of VTE (patient smokes 1 ppd of cigarettes) and heart disease.  Will check CA-125 today, then attempt a laparoscopic right ovarian cystectomy, possible  oophorectomy. The risks of surgery were discussed in detail with the patient including but not limited to:  bleeding; infection; injury to bowel, bladder, ureters and major vessels or other surrounding organs; need for additional procedures including laparotomy; thromboembolic phenomenon, incisional problems and other postoperative or anesthesia complications.  Patient was told that the likelihood that her condition and symptoms will be treated effectively with this surgical management was very high; the postoperative expectations were also discussed in detail. The patient also understands the alternative treatment options which were discussed in full. All questions were answered.  She was told that she will be contacted by our surgical scheduler regarding the time and date of her surgery. Printed patient education handouts about the procedure were given to the patient to review at home.  Routine preventative health maintenance measures emphasized;she filled out mammogram application and was given information about free cervical cancer screening. . Please refer to After Visit Summary for other counseling recommendations.   Return if symptoms worsen or fail to improve.   Total face-to-face time with patient: 30 minutes.  Over 50% of encounter was spent on counseling and coordination of care.   Verita Schneiders, MD, [place] Attending [facility], [facility] for [employer], [place]

## 2016-12-09 NOTE — Patient Instructions (Addendum)
Ovarian Cyst An ovarian cyst is a fluid-filled sac that forms on an ovary. The ovaries are small organs that produce eggs in women. Various types of cysts can form on the ovaries. Some may cause symptoms and require treatment. Most ovarian cysts go away on their own, are not cancerous (are benign), and do not cause problems. Common types of ovarian cysts include:  Functional (follicle) cysts. ? Occur during the menstrual cycle, and usually go away with the next menstrual cycle if you do not get pregnant. ? Usually cause no symptoms.  Endometriomas. ? Are cysts that form from the tissue that lines the uterus (endometrium). ? Are sometimes called "chocolate cysts" because they become filled with blood that turns brown. ? Can cause pain in the lower abdomen during intercourse and during your period.  Cystadenoma cysts. ? Develop from cells on the outside surface of the ovary. ? Can get very large and cause lower abdomen pain and pain with intercourse. ? Can cause severe pain if they twist or break open (rupture).  Dermoid cysts. ? Are sometimes found in both ovaries. ? May contain different kinds of body tissue, such as skin, teeth, hair, or cartilage. ? Usually do not cause symptoms unless they get very big.  Theca lutein cysts. ? Occur when too much of a certain hormone (human chorionic gonadotropin) is produced and overstimulates the ovaries to produce an egg. ? Are most common after having procedures used to assist with the conception of a baby (in vitro fertilization).  What are the causes? Ovarian cysts may be caused by:  Ovarian hyperstimulation syndrome. This is a condition that can develop from taking fertility medicines. It causes multiple large ovarian cysts to form.  Polycystic ovarian syndrome (PCOS). This is a common hormonal disorder that can cause ovarian cysts, as well as problems with your period or fertility.  What increases the risk? The following factors may make  you more likely to develop ovarian cysts:  Being overweight or obese.  Taking fertility medicines.  Taking certain forms of hormonal birth control.  Smoking.  What are the signs or symptoms? Many ovarian cysts do not cause symptoms. If symptoms are present, they may include:  Pelvic pain or pressure.  Pain in the lower abdomen.  Pain during sex.  Abdominal swelling.  Abnormal menstrual periods.  Increasing pain with menstrual periods.  How is this diagnosed? These cysts are commonly found during a routine pelvic exam. You may have tests to find out more about the cyst, such as:  Ultrasound.  X-ray of the pelvis.  CT scan.  MRI.  Blood tests.  How is this treated? Many ovarian cysts go away on their own without treatment. Your health care provider may want to check your cyst regularly for 2-3 months to see if it changes. If you are in menopause, it is especially important to have your cyst monitored closely because menopausal women have a higher rate of ovarian cancer. When treatment is needed, it may include:  Medicines to help relieve pain.  A procedure to drain the cyst (aspiration).  Surgery to remove the whole cyst.  Hormone treatment or birth control pills. These methods are sometimes used to help dissolve a cyst.  Follow these instructions at home:  Take over-the-counter and prescription medicines only as told by your health care provider.  Do not drive or use heavy machinery while taking prescription pain medicine.  Get regular pelvic exams and Pap tests as often as told by your health care   provider.  Return to your normal activities as told by your health care provider. Ask your health care provider what activities are safe for you.  Do not use any products that contain nicotine or tobacco, such as cigarettes and e-cigarettes. If you need help quitting, ask your health care provider.  Keep all follow-up visits as told by your health care provider.  This is important. Contact a health care provider if:  Your periods are late, irregular, or painful, or they stop.  You have pelvic pain that does not go away.  You have pressure on your bladder or trouble emptying your bladder completely.  You have pain during sex.  You have any of the following in your abdomen: ? A feeling of fullness. ? Pressure. ? Discomfort. ? Pain that does not go away. ? Swelling.  You feel generally ill.  You become constipated.  You lose your appetite.  You develop severe acne.  You start to have more body hair and facial hair.  You are gaining weight or losing weight without changing your exercise and eating habits.  You think you may be pregnant. Get help right away if:  You have abdominal pain that is severe or gets worse.  You cannot eat or drink without vomiting.  You suddenly develop a fever.  Your menstrual period is much heavier than usual. This information is not intended to replace advice given to you by your health care provider. Make sure you discuss any questions you have with your health care provider. Document Released: 12/28/2004 Document Revised: 07/18/2015 Document Reviewed: 06/01/2015 Elsevier Interactive Patient Education  2018 [place].    Ovarian Cystectomy Ovarian cystectomy is surgery to remove a fluid-filled sac (cyst) on an ovary. The ovaries are small organs that produce eggs in women. Various types of cysts can form on the ovaries. Most are not cancerous. Surgery may be done if a cyst is large or is causing symptoms such as pain. It may also be done for a cyst that is or might be cancerous. This surgery can be done using a laparoscopic technique or an open abdominal technique. The laparoscopic technique involves smaller cuts (incisions) and a faster recovery time. The technique used will depend on your age, the type of cyst, and whether the cyst is cancerous. The laparoscopic technique is not used for a  cancerous cyst. LET [facility] CARE PROVIDER KNOW ABOUT:  Any allergies you have.  All medicines you are taking, including vitamins, herbs, eye drops, creams, and over-the-counter medicines.  Previous problems you or members of your family have had with the use of anesthetics.  Any blood disorders you have.  Previous surgeries you have had.  Medical conditions you have.  Any chance you might be pregnant. RISKS AND COMPLICATIONS Generally, this is a safe procedure. However, as with any procedure, complications can occur. Possible complications include:  Excessive bleeding.  Infection.  Injury to other organs.  Blood clots.  Becoming incapable of getting pregnant (infertile).  BEFORE THE PROCEDURE  Ask your health care provider about changing or stopping any regular medicines. Avoid taking aspirin, ibuprofen, or blood thinners as directed by your health care provider.  Do not eat or drink anything after midnight the night before surgery.  If you smoke, do not smoke for at least 2 weeks before your surgery.  Do not drink alcohol the day before your surgery.  Let your health care provider know if you develop a cold or any infection before your surgery.  Arrange  for someone to drive you home after the procedure or after your hospital stay. Also arrange for someone to help you with activities during recovery. PROCEDURE Either a laparoscopic technique or an open abdominal technique may be used for this surgery.  Small monitors will be put on your body. They are used to check your heart, blood pressure, and oxygen level.  An IV access tube will be put into one of your veins. Medicine will be able to flow directly into your body through this IV tube.  You might be given a medicine to help you relax (sedative).  You will be given a medicine to make you sleep (general anesthetic). A breathing tube may be placed into your lungs during the procedure.  Laparoscopic  Technique  Several small cuts (incisions) are made in your abdomen. These are typically about 1 to 2 cm long.  Your abdomen will be filled with carbon dioxide gas so that it expands. This gives the surgeon more room to operate and makes your organs easier to see.  A thin, lighted tube with a tiny camera on the end (laparoscope) is put through one of the small incisions. The camera on the laparoscope sends a picture to a TV screen in the operating room. This gives the surgeon a good view inside your abdomen.  Hollow tubes are put through the other small incisions in your abdomen. The tools needed for the procedure are put through these tubes.  The ovary with the cyst is identified, and the cyst is removed. It is sent to the lab for testing. If it is cancer, both ovaries may need to be removed during a different surgery.  Tools are removed. The incisions are then closed with stitches or skin glue, and dressings may be applied. Open Abdominal Technique  A single large incision is made along your bikini line or in the middle of your lower abdomen.  The ovary with the cyst is identified, and the cyst is removed. It is sent to the lab for testing. If it is cancer, both ovaries may need to be removed during a different surgery.  The incision is then closed with stitches or staples. What to expect after the procedure  You will wake up from anesthesia and be taken to a recovery area.  If you had laparoscopic surgery, you may be able to go home the same day, or you may need to stay in the hospital overnight.  If you had open abdominal surgery, you will need to stay in the hospital for a few days.  Your IV access tube and catheter will be removed the first or second day, after you are able to eat and drink enough.  You may be given medicine to relieve pain or to help you sleep.  You may be given an antibiotic medicine if needed. This information is not intended to replace advice given to you  by your health care provider. Make sure you discuss any questions you have with your health care provider. Document Released: 10/25/2006 Document Revised: 06/05/2015 Document Reviewed: 08/09/2012 Elsevier Interactive Patient Education  2017 [employer].

## 2016-12-10 LAB — CA 125: CANCER ANTIGEN (CA) 125: 18.7 U/mL (ref 0.0–38.1)

## 2016-12-13 ENCOUNTER — Encounter (HOSPITAL_COMMUNITY): Payer: Self-pay

## 2016-12-14 ENCOUNTER — Other Ambulatory Visit: Payer: Self-pay | Admitting: Obstetrics & Gynecology

## 2016-12-14 DIAGNOSIS — Z1231 Encounter for screening mammogram for malignant neoplasm of breast: Secondary | ICD-10-CM

## 2017-01-11 DIAGNOSIS — A5901 Trichomonal vulvovaginitis: Secondary | ICD-10-CM

## 2017-01-11 HISTORY — DX: Trichomonal vulvovaginitis: A59.01

## 2017-01-17 ENCOUNTER — Encounter (HOSPITAL_COMMUNITY): Payer: Self-pay

## 2017-01-20 ENCOUNTER — Telehealth (HOSPITAL_COMMUNITY): Payer: Self-pay

## 2017-01-20 NOTE — Telephone Encounter (Signed)
Had a cancellation for an earlier surgery appt slot, called to offer Julie Cooley that earlier slot, no answer, left voicmail

## 2017-02-01 ENCOUNTER — Ambulatory Visit (INDEPENDENT_AMBULATORY_CARE_PROVIDER_SITE_OTHER): Payer: Self-pay

## 2017-02-01 VITALS — BP 130/77 | HR 78

## 2017-02-01 DIAGNOSIS — R102 Pelvic and perineal pain: Secondary | ICD-10-CM

## 2017-02-01 DIAGNOSIS — Z113 Encounter for screening for infections with a predominantly sexual mode of transmission: Secondary | ICD-10-CM

## 2017-02-01 NOTE — Progress Notes (Signed)
Pt presented to the office for STD check. Pt states that she has been bleeding and experiencing mild pain for two weeks. Pt self swabbed and went to the lab.Informed pt. that she will receive a phone call for her results. Pt verbalized understanding.

## 2017-02-02 LAB — CERVICOVAGINAL ANCILLARY ONLY
Bacterial vaginitis: POSITIVE — AB
CANDIDA VAGINITIS: NEGATIVE
Chlamydia: NEGATIVE
Neisseria Gonorrhea: NEGATIVE
Trichomonas: POSITIVE — AB

## 2017-02-02 LAB — HIV ANTIBODY (ROUTINE TESTING W REFLEX): HIV Screen 4th Generation wRfx: NONREACTIVE

## 2017-02-02 LAB — RPR: RPR: NONREACTIVE

## 2017-02-03 ENCOUNTER — Telehealth: Payer: Self-pay | Admitting: General Practice

## 2017-02-03 MED ORDER — METRONIDAZOLE 500 MG PO TABS: 2000.0000 mg | ORAL_TABLET | Freq: Once | ORAL | 0 refills | Status: AC

## 2017-02-03 NOTE — Telephone Encounter (Signed)
-----   Message from Gailen Shelter, MD sent at 02/03/2017 11:06 AM EST ----- +Trichomonas - Rx for Flagyl called in to pharmacy. Routed to clinical pool to notify patient and counsel on partner treatment as well.

## 2017-02-03 NOTE — Progress Notes (Signed)
I was available in clinic at the time of patient's visit. Agree with nursing note.   Almyra Free P. Degele, MD OB Fellow

## 2017-02-03 NOTE — Progress Notes (Signed)
+  Trichomonas. Flagyl 2g called in pharmacy.  Needs to be counseled on partner treatment as well.  Almyra Free P. Teja Costen, MD OB Fellow

## 2017-02-03 NOTE — Addendum Note (Signed)
Addended by: Gailen Shelter on: 02/03/2017 11:02 AM   Modules accepted: Orders

## 2017-02-03 NOTE — Telephone Encounter (Signed)
Called patient & informed her of results, medication at pharmacy & importance of partner treatment/abstaining for 10 days. Patient verbalized understanding to all & had no questions

## 2017-02-10 NOTE — Patient Instructions (Addendum)
Your procedure is scheduled on:  Tuesday, Feb 12  Enter through the Main Entrance of [facility] at: 11:30 am  Pick up the phone at the desk and dial [phone].  Call this number if you have problems the morning of surgery: [phone].  Remember: Do NOT eat or Do NOT drink clear liquids (including water) after midnight Monday  Take these medicines the morning of surgery with a SIP OF WATER:  None  Bring inhaler with you on day of surgery.  Do Not smoke on the day of surgery.  Stop herbal medications and supplements at this time.  Do NOT wear jewelry (body piercing), metal hair clips/bobby pins, make-up, or nail polish. Do NOT wear lotions, powders, or perfumes.  You may wear deoderant. Do NOT shave for 48 hours prior to surgery. Do NOT bring valuables to the hospital. Contacts, dentures, or bridgework may not be worn into surgery.  Leave suitcase in car.  After surgery it may be brought to your room.  For patients admitted to the hospital, checkout time is 11:00 AM the day of discharge. Have a responsible adult drive you home and stay with you for 24 hours after your procedure

## 2017-02-15 ENCOUNTER — Encounter (HOSPITAL_COMMUNITY)
Admission: RE | Admit: 2017-02-15 | Discharge: 2017-02-15 | Disposition: A | Discharge status: Home / Self Care | Payer: Self-pay | Source: Ambulatory Visit | Attending: Obstetrics & Gynecology | Admitting: Obstetrics & Gynecology

## 2017-02-15 ENCOUNTER — Encounter (HOSPITAL_COMMUNITY): Payer: Self-pay

## 2017-02-15 ENCOUNTER — Other Ambulatory Visit: Payer: Self-pay

## 2017-02-15 DIAGNOSIS — N83201 Unspecified ovarian cyst, right side: Secondary | ICD-10-CM | POA: Insufficient documentation

## 2017-02-15 DIAGNOSIS — J22 Unspecified acute lower respiratory infection: Secondary | ICD-10-CM

## 2017-02-15 HISTORY — DX: Unspecified acute lower respiratory infection: J22

## 2017-02-15 HISTORY — DX: Cellulitis, unspecified: L03.90

## 2017-02-15 LAB — CBC
HCT: 45.5 % (ref 36.0–46.0)
Hemoglobin: 15.3 g/dL — ABNORMAL HIGH (ref 12.0–15.0)
MCH: 31.3 pg (ref 26.0–34.0)
MCHC: 33.6 g/dL (ref 30.0–36.0)
MCV: 93 fL (ref 78.0–100.0)
PLATELETS: 351 10*3/uL (ref 150–400)
RBC: 4.89 MIL/uL (ref 3.87–5.11)
RDW: 13.6 % (ref 11.5–15.5)
WBC: 12.8 10*3/uL — AB (ref 4.0–10.5)

## 2017-02-22 ENCOUNTER — Ambulatory Visit (HOSPITAL_COMMUNITY): Payer: Self-pay | Admitting: Certified Registered Nurse Anesthetist

## 2017-02-22 ENCOUNTER — Ambulatory Visit (HOSPITAL_COMMUNITY)
Admission: AD | Admit: 2017-02-22 | Discharge: 2017-02-22 | Disposition: A | Discharge status: Home / Self Care | Payer: Self-pay | Source: Ambulatory Visit | Attending: Obstetrics & Gynecology | Admitting: Obstetrics & Gynecology

## 2017-02-22 ENCOUNTER — Encounter (HOSPITAL_COMMUNITY): Payer: Self-pay | Admitting: Certified Registered Nurse Anesthetist

## 2017-02-22 ENCOUNTER — Encounter (HOSPITAL_COMMUNITY)
Admission: AD | Disposition: A | Discharge status: Home / Self Care | Payer: Self-pay | Source: Ambulatory Visit | Attending: Obstetrics & Gynecology

## 2017-02-22 DIAGNOSIS — N1330 Unspecified hydronephrosis: Secondary | ICD-10-CM | POA: Insufficient documentation

## 2017-02-22 DIAGNOSIS — N83299 Other ovarian cyst, unspecified side: Secondary | ICD-10-CM

## 2017-02-22 DIAGNOSIS — Z885 Allergy status to narcotic agent status: Secondary | ICD-10-CM | POA: Insufficient documentation

## 2017-02-22 DIAGNOSIS — N731 Chronic parametritis and pelvic cellulitis: Secondary | ICD-10-CM | POA: Diagnosis present

## 2017-02-22 DIAGNOSIS — N739 Female pelvic inflammatory disease, unspecified: Secondary | ICD-10-CM | POA: Insufficient documentation

## 2017-02-22 DIAGNOSIS — Z7982 Long term (current) use of aspirin: Secondary | ICD-10-CM | POA: Insufficient documentation

## 2017-02-22 DIAGNOSIS — N736 Female pelvic peritoneal adhesions (postinfective): Secondary | ICD-10-CM | POA: Insufficient documentation

## 2017-02-22 DIAGNOSIS — F17210 Nicotine dependence, cigarettes, uncomplicated: Secondary | ICD-10-CM | POA: Insufficient documentation

## 2017-02-22 DIAGNOSIS — D259 Leiomyoma of uterus, unspecified: Secondary | ICD-10-CM | POA: Insufficient documentation

## 2017-02-22 DIAGNOSIS — R102 Pelvic and perineal pain: Secondary | ICD-10-CM | POA: Insufficient documentation

## 2017-02-22 DIAGNOSIS — J00 Acute nasopharyngitis [common cold]: Secondary | ICD-10-CM | POA: Insufficient documentation

## 2017-02-22 HISTORY — PX: LAPAROSCOPIC LYSIS OF ADHESIONS: SHX5905

## 2017-02-22 HISTORY — DX: Chlamydial infection, unspecified: A74.9

## 2017-02-22 HISTORY — DX: Trichomonal vulvovaginitis: A59.01

## 2017-02-22 LAB — PREGNANCY, URINE: Preg Test, Ur: NEGATIVE

## 2017-02-22 SURGERY — LYSIS, ADHESIONS, LAPAROSCOPIC
Anesthesia: General | Site: Abdomen | Laterality: Right

## 2017-02-22 MED ORDER — PROPOFOL 10 MG/ML IV BOLUS
INTRAVENOUS | Status: DC | PRN
  Administered 2017-02-22: 200 mg via INTRAVENOUS

## 2017-02-22 MED ORDER — FENTANYL CITRATE (PF) 100 MCG/2ML IJ SOLN
25.0000 ug | INTRAMUSCULAR | Status: DC | PRN
  Administered 2017-02-22: 50 ug via INTRAVENOUS

## 2017-02-22 MED ORDER — MIDAZOLAM HCL 2 MG/2ML IJ SOLN
INTRAMUSCULAR | Status: AC
  Filled 2017-02-22: qty 2

## 2017-02-22 MED ORDER — ACETAMINOPHEN 500 MG PO TABS
ORAL_TABLET | ORAL | Status: AC
  Filled 2017-02-22: qty 2

## 2017-02-22 MED ORDER — FENTANYL CITRATE (PF) 100 MCG/2ML IJ SOLN
INTRAMUSCULAR | Status: DC | PRN
  Administered 2017-02-22 (×2): 50 ug via INTRAVENOUS

## 2017-02-22 MED ORDER — ROCURONIUM BROMIDE 100 MG/10ML IV SOLN
INTRAVENOUS | Status: DC | PRN
  Administered 2017-02-22: 40 mg via INTRAVENOUS

## 2017-02-22 MED ORDER — ROCURONIUM BROMIDE 100 MG/10ML IV SOLN
INTRAVENOUS | Status: AC
  Filled 2017-02-22: qty 1

## 2017-02-22 MED ORDER — BUPIVACAINE HCL (PF) 0.5 % IJ SOLN
INTRAMUSCULAR | Status: AC
  Filled 2017-02-22: qty 30

## 2017-02-22 MED ORDER — SCOPOLAMINE 1 MG/3DAYS TD PT72
MEDICATED_PATCH | TRANSDERMAL | Status: AC
  Administered 2017-02-22: 1.5 mg via TRANSDERMAL
  Filled 2017-02-22: qty 1

## 2017-02-22 MED ORDER — ONDANSETRON HCL 4 MG/2ML IJ SOLN
INTRAMUSCULAR | Status: AC
  Filled 2017-02-22: qty 2

## 2017-02-22 MED ORDER — SUGAMMADEX SODIUM 200 MG/2ML IV SOLN
INTRAVENOUS | Status: AC
  Filled 2017-02-22: qty 2

## 2017-02-22 MED ORDER — HYDROCODONE-ACETAMINOPHEN 5-325 MG PO TABS: 2.0000 | ORAL_TABLET | Freq: Four times a day (QID) | ORAL | 0 refills | Status: DC | PRN

## 2017-02-22 MED ORDER — LIDOCAINE HCL (CARDIAC) 20 MG/ML IV SOLN
INTRAVENOUS | Status: DC | PRN
  Administered 2017-02-22: 50 mg via INTRAVENOUS

## 2017-02-22 MED ORDER — LACTATED RINGERS IV SOLN
INTRAVENOUS | Status: DC
  Administered 2017-02-22 (×2): via INTRAVENOUS

## 2017-02-22 MED ORDER — DEXAMETHASONE SODIUM PHOSPHATE 10 MG/ML IJ SOLN
INTRAMUSCULAR | Status: DC | PRN
  Administered 2017-02-22: 4 mg via INTRAVENOUS

## 2017-02-22 MED ORDER — PROPOFOL 10 MG/ML IV BOLUS
INTRAVENOUS | Status: AC
  Filled 2017-02-22: qty 20

## 2017-02-22 MED ORDER — ONDANSETRON HCL 4 MG/2ML IJ SOLN
INTRAMUSCULAR | Status: DC | PRN
  Administered 2017-02-22: 4 mg via INTRAVENOUS

## 2017-02-22 MED ORDER — MIDAZOLAM HCL 2 MG/2ML IJ SOLN
INTRAMUSCULAR | Status: DC | PRN
  Administered 2017-02-22 (×2): 1 mg via INTRAVENOUS

## 2017-02-22 MED ORDER — BUPIVACAINE HCL (PF) 0.5 % IJ SOLN
INTRAMUSCULAR | Status: DC | PRN
  Administered 2017-02-22: 9 mL

## 2017-02-22 MED ORDER — ACETAMINOPHEN 500 MG PO TABS
1000.0000 mg | ORAL_TABLET | Freq: Once | ORAL | Status: AC
  Administered 2017-02-22: 1000 mg via ORAL

## 2017-02-22 MED ORDER — SUGAMMADEX SODIUM 200 MG/2ML IV SOLN
INTRAVENOUS | Status: DC | PRN
  Administered 2017-02-22: 125 mg via INTRAVENOUS

## 2017-02-22 MED ORDER — SCOPOLAMINE 1 MG/3DAYS TD PT72
1.0000 | MEDICATED_PATCH | Freq: Once | TRANSDERMAL | Status: DC
  Administered 2017-02-22: 1.5 mg via TRANSDERMAL

## 2017-02-22 MED ORDER — FENTANYL CITRATE (PF) 100 MCG/2ML IJ SOLN
INTRAMUSCULAR | Status: AC
  Filled 2017-02-22: qty 2

## 2017-02-22 MED ORDER — KETOROLAC TROMETHAMINE 30 MG/ML IJ SOLN
INTRAMUSCULAR | Status: AC
  Filled 2017-02-22: qty 1

## 2017-02-22 MED ORDER — DEXAMETHASONE SODIUM PHOSPHATE 4 MG/ML IJ SOLN
INTRAMUSCULAR | Status: AC
  Filled 2017-02-22: qty 1

## 2017-02-22 MED ORDER — DOCUSATE SODIUM 100 MG PO CAPS: 100.0000 mg | ORAL_CAPSULE | Freq: Two times a day (BID) | ORAL | 2 refills | Status: DC | PRN

## 2017-02-22 MED ORDER — KETOROLAC TROMETHAMINE 30 MG/ML IJ SOLN
INTRAMUSCULAR | Status: DC | PRN
  Administered 2017-02-22: 30 mg via INTRAVENOUS

## 2017-02-22 MED ORDER — PROMETHAZINE HCL 25 MG/ML IJ SOLN: 6.2500 mg | INTRAMUSCULAR | Status: DC | PRN

## 2017-02-22 MED ORDER — IBUPROFEN 800 MG PO TABS: 800.0000 mg | ORAL_TABLET | Freq: Three times a day (TID) | ORAL | 3 refills | Status: DC | PRN

## 2017-02-22 SURGICAL SUPPLY — 28 items
CABLE HIGH FREQUENCY MONO STRZ (ELECTRODE) IMPLANT
DERMABOND ADVANCED (GAUZE/BANDAGES/DRESSINGS) ×2
DERMABOND ADVANCED .7 DNX12 (GAUZE/BANDAGES/DRESSINGS) ×2 IMPLANT
DRSG OPSITE POSTOP 3X4 (GAUZE/BANDAGES/DRESSINGS) ×4 IMPLANT
DURAPREP 26ML APPLICATOR (WOUND CARE) ×4 IMPLANT
GLOVE BIOGEL PI IND STRL 7.0 (GLOVE) ×8 IMPLANT
GLOVE BIOGEL PI INDICATOR 7.0 (GLOVE) ×8
GLOVE ECLIPSE 7.0 STRL STRAW (GLOVE) ×4 IMPLANT
GOWN STRL REUS W/TWL LRG LVL3 (GOWN DISPOSABLE) ×12 IMPLANT
NEEDLE INSUFFLATION 120MM (ENDOMECHANICALS) IMPLANT
NS IRRIG 1000ML POUR BTL (IV SOLUTION) ×4 IMPLANT
PACK LAPAROSCOPY BASIN (CUSTOM PROCEDURE TRAY) ×4 IMPLANT
PACK TRENDGUARD 450 HYBRID PRO (MISCELLANEOUS) ×2 IMPLANT
PACK TRENDGUARD 600 HYBRD PROC (MISCELLANEOUS) IMPLANT
POUCH SPECIMEN RETRIEVAL 10MM (ENDOMECHANICALS) IMPLANT
PROTECTOR NERVE ULNAR (MISCELLANEOUS) ×8 IMPLANT
SET IRRIG TUBING LAPAROSCOPIC (IRRIGATION / IRRIGATOR) IMPLANT
SHEARS HARMONIC ACE PLUS 36CM (ENDOMECHANICALS) ×4 IMPLANT
SLEEVE XCEL OPT CAN 5 100 (ENDOMECHANICALS) ×4 IMPLANT
SUT VICRYL 0 UR6 27IN ABS (SUTURE) ×8 IMPLANT
SUT VICRYL 4-0 PS2 18IN ABS (SUTURE) ×4 IMPLANT
TOWEL OR 17X24 6PK STRL BLUE (TOWEL DISPOSABLE) ×8 IMPLANT
TRAY FOLEY CATH SILVER 14FR (SET/KITS/TRAYS/PACK) ×4 IMPLANT
TRENDGUARD 450 HYBRID PRO PACK (MISCELLANEOUS) ×4
TRENDGUARD 600 HYBRID PROC PK (MISCELLANEOUS)
TROCAR XCEL NON-BLD 11X100MML (ENDOMECHANICALS) ×4 IMPLANT
TROCAR XCEL NON-BLD 5MMX100MML (ENDOMECHANICALS) ×4 IMPLANT
WARMER LAPAROSCOPE (MISCELLANEOUS) ×4 IMPLANT

## 2017-02-22 NOTE — H&P (Signed)
Preoperative History and Physical  Julie Cooley is a 42 y.o. F here for surgical management of complex right ovarian cyst.   No significant preoperative concerns.  Proposed surgery: Laparoscopic right ovarian cystectomy, possible oophorectomy  Past Medical History:  Diagnosis Date  . Cellulitis    LEFT FOOT  . Chest cold 02/15/2017   PRESENTLY HAS CHEST COLD   Past Surgical History:  Procedure Laterality Date  . DIAGNOSTIC LAPAROSCOPY  1990   REMOVED LEFT TUBE AND OVARY   OB History  No data available  Patient denies any other pertinent gynecologic issues.   No current facility-administered medications on file prior to encounter.    Current Outpatient Medications on File Prior to Encounter  Medication Sig Dispense Refill  . aspirin EC 81 MG tablet Take 81 mg by mouth daily.     Allergies  Allergen Reactions  . Demerol [Meperidine] Nausea And Vomiting  . Morphine And Related Nausea And Vomiting    Social History:   reports that she has been smoking cigarettes.  She has a 20.00 pack-year smoking history. she has never used smokeless tobacco. She reports that she uses drugs. Drug: Marijuana. She reports that she does not drink alcohol.  History reviewed. No pertinent family history.  Review of Systems: Pertinent items noted in HPI and remainder of comprehensive ROS otherwise negative.  PHYSICAL EXAM: Blood pressure 112/84, pulse 63, temperature 97.8 F (36.6 C), temperature source Oral, resp. rate 16, SpO2 100 %. CONSTITUTIONAL: Well-developed, well-nourished female in no acute distress.  HENT:  Normocephalic, atraumatic, External right and left ear normal. Oropharynx is clear and moist EYES: Conjunctivae and EOM are normal. Pupils are equal, round, and reactive to light. No scleral icterus.  NECK: Normal range of motion, supple, no masses SKIN: Skin is warm and dry. No rash noted. Not diaphoretic. No erythema. No pallor. NEUROLOGIC: Alert and oriented to person, place,  and time. Normal reflexes, muscle tone coordination. No cranial nerve deficit noted. PSYCHIATRIC: Normal mood and affect. Normal behavior. Normal judgment and thought content. CARDIOVASCULAR: Normal heart rate noted, regular rhythm RESPIRATORY: Effort and breath sounds normal, no problems with respiration noted ABDOMEN: Soft, nontender, nondistended. PELVIC: Deferred MUSCULOSKELETAL: Normal range of motion. No edema and no tenderness. 2+ distal pulses.  Labs: Results for orders placed or performed during the hospital encounter of 02/22/17 (from the past 336 hour(s))  Pregnancy, urine   Collection Time: 02/22/17 11:30 AM  Result Value Ref Range   Preg Test, Ur NEGATIVE NEGATIVE  Results for orders placed or performed during the hospital encounter of 02/15/17 (from the past 336 hour(s))  CBC   Collection Time: 02/15/17  2:11 PM  Result Value Ref Range   WBC 12.8 (H) 4.0 - 10.5 K/uL   RBC 4.89 3.87 - 5.11 MIL/uL   Hemoglobin 15.3 (H) 12.0 - 15.0 g/dL   HCT 45.5 36.0 - 46.0 %   MCV 93.0 78.0 - 100.0 fL   MCH 31.3 26.0 - 34.0 pg   MCHC 33.6 30.0 - 36.0 g/dL   RDW 13.6 11.5 - 15.5 %   Platelets 351 150 - 400 K/uL   12/09/2016  CA-125 18.7  Imaging Studies: Ct Abdomen Pelvis W Contrast  Result Date: 12/04/2016 CLINICAL DATA:  Right flank pain 1 week ago, now located in the right lower quadrant. EXAM: CT ABDOMEN AND PELVIS WITH CONTRAST TECHNIQUE: Multidetector CT imaging of the abdomen and pelvis was performed using the standard protocol following bolus administration of intravenous contrast. CONTRAST:  115mL  ISOVUE-300 IOPAMIDOL (ISOVUE-300) INJECTION 61% COMPARISON:  None. FINDINGS: Lower chest: Clear lung bases. Hepatobiliary: No focal liver abnormality is seen. No gallstones, gallbladder wall thickening, or biliary dilatation. Pancreas: Unremarkable. Spleen: Unremarkable. Adrenals/Urinary Tract: Unremarkable adrenal glands. Mild right hydronephrosis without ureteral dilatation or  calculi identified. Unremarkable left kidney and bladder. Stomach/Bowel: The stomach is within normal limits. There is no evidence of bowel obstruction or gross bowel wall thickening. The appendix is unremarkable. Vascular/Lymphatic: Mild soft plaque in the distal abdominal aorta without aneurysm. No enlarged lymph nodes. Reproductive: Small to moderate volume free fluid in the pelvis asymmetric to the right. Enlarged right ovary with a 3.6 x 2.2 cm hypoattenuating/cystic focus with mild peripheral enhancement in the right adnexa and asymmetrically prominent right adnexal vascularity. Unremarkable uterus. No left adnexal mass. Other: No abdominal wall mass or hernia. Musculoskeletal: No acute osseous abnormality or suspicious osseous lesion. IMPRESSION: 1. Pelvic free fluid. Enlarged right ovary with underlying 3.6 cm low-density/cystic structure. Pelvic ultrasound is recommended for further evaluation, including Doppler to assess for torsion. 2. Mild right hydronephrosis. No obstructing stone or ureteral dilatation. This may be secondary to the right adnexal process. 3. Normal appendix. Electronically Signed   By: Logan Bores M.D.   On: 12/04/2016 12:56   US Pelvic Doppler (torsion R/o Or Mass Arterial Flow)  Result Date: 12/04/2016 CLINICAL DATA:  Acute right lower quadrant abdominal pain. EXAM: TRANSABDOMINAL AND TRANSVAGINAL ULTRASOUND OF PELVIS DOPPLER ULTRASOUND OF OVARIES TECHNIQUE: Both transabdominal and transvaginal ultrasound examinations of the pelvis were performed. Transabdominal technique was performed for global imaging of the pelvis including uterus, ovaries, adnexal regions, and pelvic cul-de-sac. It was necessary to proceed with endovaginal exam following the transabdominal exam to visualize the endometrium and ovaries. Color and duplex Doppler ultrasound was utilized to evaluate blood flow to the ovaries. COMPARISON:  CT scan of same day. FINDINGS: Uterus Measurements: 6.8 x 4.5 x 3.7 cm.  1.2 cm fibroid is noted posteriorly. Endometrium Thickness: 6.6 mm which is within normal limits. No focal abnormality is noted. Right ovary Measurements: 4.7 x 4.1 x 2.4 cm. 3.1 x 2.9 x 1.2 cm complex but predominantly cystic structure is noted, with with multiple septations. Left ovary Surgically removed. Pulsed Doppler evaluation of right ovary demonstrates normal low-resistance arterial and venous waveforms. Other findings Mild amount of free fluid is noted which most likely is physiologic. IMPRESSION: Small uterine fibroid is noted. 3.1 x 2.9 x 1.2 cm complex predominantly cystic mass is noted in right ovary with multiple septations. 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 Consultation with gynecological surgery is recommended, or MRI may be performed further evaluation. Electronically Signed   By: Marijo Conception, M.D.   On: 12/04/2016 15:25    Assessment: Patient Active Problem List   Diagnosis Date Noted  . Complex ovarian cyst 02/22/2017    Plan: Patient will undergo surgical management with laparoscopic right ovarian cystectomy, possible oophorectomy.   The risks of surgery were discussed in detail with the patient including but not limited to: bleeding which may require transfusion or reoperation; infection which may require antibiotics; injury to surrounding organs which may involve bowel, bladder, ureters ; need for additional procedures including laparotomy; thromboembolic phenomenon, surgical site problems and other postoperative/anesthesia complications. Likelihood of success in alleviating the patient's condition was discussed. Routine postoperative instructions will be reviewed with the patient and her family in detail after surgery.  The patient concurred with the proposed plan, giving informed written  consent for the surgery.  Patient has been NPO since last night and she will remain NPO for procedure.  Anesthesia and OR  aware.  Preoperative prophylactic antibiotics and SCDs ordered on call to the OR.  To OR when ready.     Verita Schneiders, MD, [place] for [facility], [place]

## 2017-02-22 NOTE — Op Note (Signed)
Julie Cooley PROCEDURE DATE: 02/22/2017  PREOPERATIVE DIAGNOSIS: Complex right ovarian cyst, pelvic pain POSTOPERATIVE DIAGNOSIS: Ovarian cyst not visualized, significant chronic pelvic inflammatory disease (PID) PROCEDURE: Operative laparoscopy, lysis of adhesions SURGEON:  Dr. Verita Schneiders ASSISTANT: Dr. Clovia Cuff  INDICATIONS: 42 y.o. G0 with a complex right ovarian cyst and pelvic pain desiring surgical  managment.   Please see preoperative notes for further details.  Risks of surgery were discussed with the patient including but not limited to: bleeding which may require transfusion or reoperation; infection which may require antibiotics; injury to bowel, bladder, ureters or other surrounding organs; need for additional procedures including laparotomy; thromboembolic phenomenon, incisional problems and other postoperative/anesthesia complications. Written informed consent was obtained.    FINDINGS:  Dense adhesions of the omentum to anterior abdominal wall and pelvic sidewalls. Adhesions between uterus and bladder, and uterus and posterior cul-de-sac which was almost obliterated with adhesive disease. Adhesions of uterus to right adnexa, dilated fallopian tube tightly adherent to ovary. No ovarian cyst visualized. Ovary was tightly adherent to posterior cul-de-sac and rectosigmoid colon, unable to appreciate any separation plane for dissection.  Surgically absent left adnexa.  Adhesions between uterus, posterior cul-de-sac and right adnexa lysed extensively.  Normal upper abdomen. All findings consistent with chronic pelvic inflammatory disease (PID).  ANESTHESIA:  General INTRAVENOUS FLUIDS: 1000 ml ESTIMATED BLOOD LOSS: 10 ml SPECIMENS: None COMPLICATIONS: None immediate  PROCEDURE IN DETAIL:  The patient had sequential compression devices applied to her lower extremities while in the preoperative area.  She was then taken to the operating room where general anesthesia was administered  and was found to be adequate.  She was placed in the dorsal lithotomy position, and was prepped and draped in a sterile manner.  A Foley catheter was inserted into her bladder and attached to constant drainage and a uterine manipulator was then advanced into the uterus .  After an adequate timeout was performed, attention was turned to the abdomen where an umbilical incision was made with the scalpel.  The Optiview 11-mm trocar and sleeve were then advanced without difficulty with the laparoscope under direct visualization into the abdomen.  The abdomen was then insufflated with carbon dioxide gas and adequate pneumoperitoneum was obtained.   A detailed survey of the patient's pelvis and abdomen revealed the findings as mentioned above.  Bilateral 5-mm trocars were placed.  Several of the adhesions especially the adhesions between uterus, posterior cul-de-sac and right adnexa were lysed extensively using the Harmonic device.  The ovary was then noted to be tightly adherent to the rectosigmoid colon, unable to appreciate any separation plane for dissection. No overt cyst was noted.  At this point, the decision was made to stop the procedure.  The operative site was surveyed, and it was found to be hemostatic.  No intraoperative injury to surrounding organs was noted.  The abdomen was desufflated and all instruments were then removed from the patient's abdomen. The uterine manipulator was removed without complications.  All incisions were closed with 4-0 Vicryl and Dermabond. The patient tolerated the procedures well.  All instruments, needles, and sponge counts were correct x 3. The patient was taken to the recovery room in stable condition.   Findings were discussed with patient after surgery.  Will get a pelvic ultrasound. If cyst is still noted to be present and is still concerning, or if her pain worsens, recommend abdominal hysterectomy and right salpingo-ophorectomy.  There is a high risk of bowel injury  given dense adhesions to  the rectosigmoid colon.  The patient will be discharged to home as per PACU criteria.  Routine postoperative instructions given.  She was prescribed Vicodin, Ibuprofen and Colace.  She will follow up in the clinic on 03/09/17 for postoperative evaluation.    Verita Schneiders, MD, [place] for [facility], [place]

## 2017-02-22 NOTE — Anesthesia Preprocedure Evaluation (Signed)
Anesthesia Evaluation  Patient identified by MRN, date of birth, ID band Patient awake    Reviewed: Allergy & Precautions, NPO status , Patient's Chart, lab work & pertinent test results  Airway Mallampati: II  TM Distance: >3 FB Neck ROM: Full    Dental  (+) Teeth Intact, Dental Advisory Given   Pulmonary Current Smoker,    Pulmonary exam normal breath sounds clear to auscultation       Cardiovascular negative cardio ROS Normal cardiovascular exam Rhythm:Regular Rate:Normal     Neuro/Psych negative neurological ROS  negative psych ROS   GI/Hepatic negative GI ROS, (+)     substance abuse  marijuana use,   Endo/Other  negative endocrine ROS  Renal/GU negative Renal ROS     Musculoskeletal negative musculoskeletal ROS (+)   Abdominal   Peds  Hematology negative hematology ROS (+)   Anesthesia Other Findings Day of surgery medications reviewed with the patient.  Reproductive/Obstetrics RIGHT OVARIAN CYST                             Anesthesia Physical Anesthesia Plan  ASA: II  Anesthesia Plan: General   Post-op Pain Management:    Induction: Intravenous  PONV Risk Score and Plan: 3 and Midazolam, Scopolamine patch - Pre-op, Dexamethasone and Ondansetron  Airway Management Planned: Oral ETT  Additional Equipment:   Intra-op Plan:   Post-operative Plan: Extubation in OR  Informed Consent: I have reviewed the patients History and Physical, chart, labs and discussed the procedure including the risks, benefits and alternatives for the proposed anesthesia with the patient or authorized representative who has indicated his/her understanding and acceptance.   Dental advisory given  Plan Discussed with: CRNA  Anesthesia Plan Comments: (Risks/benefits of general anesthesia discussed with patient including risk of damage to teeth, lips, gum, and tongue, nausea/vomiting, allergic  reactions to medications, and the possibility of heart attack, stroke and death.  All patient questions answered.  Patient wishes to proceed.)        Anesthesia Quick Evaluation

## 2017-02-22 NOTE — Anesthesia Procedure Notes (Signed)
Procedure Name: Intubation Date/Time: 02/22/2017 1:02 PM Performed by: Bufford Spikes, CRNA Pre-anesthesia Checklist: Patient identified, Emergency Drugs available, Suction available and Patient being monitored Patient Re-evaluated:Patient Re-evaluated prior to induction Oxygen Delivery Method: Circle system utilized Preoxygenation: Pre-oxygenation with 100% oxygen Induction Type: IV induction Ventilation: Mask ventilation without difficulty Laryngoscope Size: Miller and 2 Tube type: Oral Tube size: 7.0 mm Number of attempts: 1 Airway Equipment and Method: Stylet and Oral airway Placement Confirmation: ETT inserted through vocal cords under direct vision,  positive ETCO2 and breath sounds checked- equal and bilateral Secured at: 21 cm Tube secured with: Tape Dental Injury: Teeth and Oropharynx as per pre-operative assessment

## 2017-02-22 NOTE — Transfer of Care (Signed)
Immediate Anesthesia Transfer of Care Note  Patient: Julie Cooley  Procedure(s) Performed: LAPAROSCOPIC LYSIS OF ADHESIONS (N/A Abdomen)  Patient Location: PACU  Anesthesia Type:General  Level of Consciousness: awake, alert  and oriented  Airway & Oxygen Therapy: Patient Spontanous Breathing and Patient connected to nasal cannula oxygen  Post-op Assessment: Report given to RN and Post -op Vital signs reviewed and stable  Post vital signs: Reviewed and stable  Last Vitals:  Vitals:   02/22/17 1135  BP: 112/84  Pulse: 63  Resp: 16  Temp: 36.6 C  SpO2: 100%    Last Pain:  Vitals:   02/22/17 1135  TempSrc: Oral         Complications: No apparent anesthesia complications

## 2017-02-22 NOTE — Discharge Instructions (Signed)
Diagnostic Laparoscopy, Care After Refer to this sheet in the next few weeks. These instructions provide you with information about caring for yourself after your procedure. Your health care provider may also give you more specific instructions. Your treatment has been planned according to current medical practices, but problems sometimes occur. Call your health care provider if you have any problems or questions after your procedure. What can I expect after the procedure? After your procedure, it is common to have mild discomfort in the throat and abdomen. Follow these instructions at home:  Take over-the-counter and prescription medicines only as told by your health care provider.  Do not drive for 24 hours if you received a sedative.  Return to your normal activities as told by your health care provider.  Do not take baths, swim, or use a hot tub until your health care provider approves. You may shower.  Follow instructions from your health care provider about how to take care of your incision. Make sure you: ? Wash your hands with soap and water before you change your bandage (dressing). If soap and water are not available, use hand sanitizer. ? Change your dressing as told by your health care provider. ? Leave stitches (sutures), skin glue, or adhesive strips in place. These skin closures may need to stay in place for 2 weeks or longer. If adhesive strip edges start to loosen and curl up, you may trim the loose edges. Do not remove adhesive strips completely unless your health care provider tells you to do that.  Check your incision area every day for signs of infection. Check for: ? More redness, swelling, or pain. ? More fluid or blood. ? Warmth. ? Pus or a bad smell.  It is your responsibility to get the results of your procedure. Ask your health care provider or the department performing the procedure when your results will be ready. Contact a health care provider if:  There is  new pain in your shoulders.  You feel light-headed or faint.  You are unable to pass gas or unable to have a bowel movement.  You feel nauseous or you vomit.  You develop a rash.  You have more redness, swelling, or pain around your incision.  You have more fluid or blood coming from your incision.  Your incision feels warm to the touch.  You have pus or a bad smell coming from your incision.  You have a fever or chills. Get help right away if:  Your pain is getting worse.  You have ongoing vomiting.  The edges of your incision open up.  You have trouble breathing.  You have chest pain. This information is not intended to replace advice given to you by your health care provider. Make sure you discuss any questions you have with your health care provider.  Post Anesthesia Home Care Instructions  Activity: Get plenty of rest for the remainder of the day. A responsible individual must stay with you for 24 hours following the procedure.  For the next 24 hours, DO NOT: -Drive a car -Paediatric nurse -Drink alcoholic beverages -Take any medication unless instructed by your physician -Make any legal decisions or sign important papers.  Meals: Start with liquid foods such as gelatin or soup. Progress to regular foods as tolerated. Avoid greasy, spicy, heavy foods. If nausea and/or vomiting occur, drink only clear liquids until the nausea and/or vomiting subsides. Call your physician if vomiting continues.  Special Instructions/Symptoms: Your throat may feel dry or sore from  the anesthesia or the breathing tube placed in your throat during surgery. If this causes discomfort, gargle with warm salt water. The discomfort should disappear within 24 hours.  If you had a scopolamine patch placed behind your ear for the management of post- operative nausea and/or vomiting:  1. The medication in the patch is effective for 72 hours, after which it should be removed.  Wrap patch in a  tissue and discard in the trash. Wash hands thoroughly with soap and water. 2. You may remove the patch earlier than 72 hours if you experience unpleasant side effects which may include dry mouth, dizziness or visual disturbances. 3. Avoid touching the patch. Wash your hands with soap and water after contact with the patch.

## 2017-02-23 ENCOUNTER — Encounter (HOSPITAL_COMMUNITY): Payer: Self-pay | Admitting: Obstetrics & Gynecology

## 2017-02-23 NOTE — Anesthesia Postprocedure Evaluation (Signed)
Anesthesia Post Note  Patient: Julie Cooley  Procedure(s) Performed: LAPAROSCOPIC LYSIS OF ADHESIONS (N/A Abdomen)     Patient location during evaluation: PACU Anesthesia Type: General Level of consciousness: awake and alert, awake and oriented Pain management: pain level controlled Vital Signs Assessment: post-procedure vital signs reviewed and stable Respiratory status: spontaneous breathing, nonlabored ventilation, respiratory function stable and patient connected to nasal cannula oxygen Cardiovascular status: blood pressure returned to baseline and stable Postop Assessment: no apparent nausea or vomiting Anesthetic complications: no    Last Vitals:  Vitals:   02/22/17 1455 02/22/17 1525  BP: 122/86 131/78  Pulse: 68 77  Resp: 16 16  Temp:    SpO2: 97% 100%    Last Pain:  Vitals:   02/22/17 1445  TempSrc:   PainSc: Asleep                 Catalina Gravel

## 2017-02-25 ENCOUNTER — Encounter: Payer: Self-pay | Admitting: Obstetrics & Gynecology

## 2017-02-25 ENCOUNTER — Telehealth: Payer: Self-pay | Admitting: General Practice

## 2017-02-25 NOTE — Telephone Encounter (Signed)
Patient called and left message on nurse line stating she had surgery on Tuesday and is bleeding a little more today than she was then. Patient also wants to know how long she will be out of work. Called patient and she states she thinks her bleeding might just be a period because she was due around now and it's not heavy bleeding. Told patient that is highly likely as surgery can sometimes make her start sooner. Patient verbalized understanding & states she is unsure how long she is out of work for. Told patient I will send a message to Dr Harolyn Rutherford for clarification and recommended she sign up for mychart so Dr Harolyn Rutherford can reach out to her with an answer. Told patient she can come by the office anytime during office hours to pick up that letter as well. Patient verbalized understanding to all & had no questions

## 2017-02-26 ENCOUNTER — Encounter: Payer: Self-pay | Admitting: Obstetrics & Gynecology

## 2017-03-01 ENCOUNTER — Ambulatory Visit (HOSPITAL_COMMUNITY)
Admission: RE | Admit: 2017-03-01 | Discharge: 2017-03-01 | Disposition: A | Discharge status: Home / Self Care | Payer: Self-pay | Source: Ambulatory Visit | Attending: Obstetrics & Gynecology | Admitting: Obstetrics & Gynecology

## 2017-03-01 DIAGNOSIS — D259 Leiomyoma of uterus, unspecified: Secondary | ICD-10-CM | POA: Insufficient documentation

## 2017-03-01 DIAGNOSIS — N83299 Other ovarian cyst, unspecified side: Secondary | ICD-10-CM

## 2017-03-02 NOTE — Telephone Encounter (Signed)
I already replied to her on MyChart. She can be out of work for up to two weeks after surgery.  Bleeding is likely her period.  We were not able to do much during surgery due to dense pelvic adhesions, so nothing that would cause her bleeding.  Also her ultrasound showed her cyst has resolved.  Please call to inform patient of recommendations.

## 2017-03-09 ENCOUNTER — Encounter: Payer: Self-pay | Admitting: General Practice

## 2017-03-09 ENCOUNTER — Ambulatory Visit: Payer: Self-pay | Admitting: Obstetrics & Gynecology

## 2017-03-27 ENCOUNTER — Encounter: Payer: Self-pay | Admitting: Obstetrics & Gynecology

## 2017-03-29 ENCOUNTER — Other Ambulatory Visit: Payer: Self-pay | Admitting: General Practice

## 2017-03-29 DIAGNOSIS — N760 Acute vaginitis: Principal | ICD-10-CM

## 2017-03-29 DIAGNOSIS — B9689 Other specified bacterial agents as the cause of diseases classified elsewhere: Secondary | ICD-10-CM

## 2017-03-29 MED ORDER — METRONIDAZOLE 500 MG PO TABS: 500.0000 mg | ORAL_TABLET | Freq: Two times a day (BID) | ORAL | 0 refills | Status: DC

## 2017-04-08 ENCOUNTER — Ambulatory Visit (INDEPENDENT_AMBULATORY_CARE_PROVIDER_SITE_OTHER): Payer: Self-pay | Admitting: Obstetrics & Gynecology

## 2017-04-08 ENCOUNTER — Encounter: Payer: Self-pay | Admitting: Obstetrics & Gynecology

## 2017-04-08 VITALS — BP 113/79 | HR 67 | Wt 137.7 lb

## 2017-04-08 DIAGNOSIS — N731 Chronic parametritis and pelvic cellulitis: Secondary | ICD-10-CM

## 2017-04-08 DIAGNOSIS — Z09 Encounter for follow-up examination after completed treatment for conditions other than malignant neoplasm: Secondary | ICD-10-CM

## 2017-04-08 NOTE — Patient Instructions (Signed)
Return to clinic for any scheduled appointments or for any gynecologic concerns as needed.   

## 2017-04-08 NOTE — Progress Notes (Signed)
Subjective:     Julie Cooley is a 42 y.o. G72 female who presents to the clinic 6 weeks status post lysis of adhesions and laparoscopy for pelvic pain.  Noted to have chronic PID. Eating a regular diet without difficulty. Bowel movements are normal. The patient is not having any pain except  Mild painat LLQ incision  The following portions of the patient's history were reviewed and updated as appropriate: allergies, current medications, past family history, past medical history, past social history, past surgical history and problem list. Normal pap smear 2 years ago.  Review of Systems Pertinent items noted in HPI and remainder of comprehensive ROS otherwise negative.    Objective:    BP 113/79   Pulse 67   Wt 137 lb 11.2 oz (62.5 kg)   BMI 25.19 kg/m  General:  alert and no distress  Abdomen: soft, bowel sounds active, non-tender  Incision:   healing well, no drainage, no erythema, no hernia, no seroma, no swelling, no dehiscence, incision well approximated. LLQ with mild irritation from suture, suture removed. Area treated with silver nitrate.     Assessment:   Doing well postoperatively. Operative findings again reviewed.    Plan:   1. Continue any current medications. 2. Wound care discussed. 3. Activity restrictions: none 4. Anticipated return to work: not applicable. 5. Discussed implications of chronic PID 5. Follow up as needed.    Verita Schneiders, MD, [place] for [facility], [place]

## 2017-04-11 ENCOUNTER — Other Ambulatory Visit: Payer: Self-pay | Admitting: Obstetrics & Gynecology

## 2017-04-11 DIAGNOSIS — Z1231 Encounter for screening mammogram for malignant neoplasm of breast: Secondary | ICD-10-CM

## 2017-05-03 ENCOUNTER — Inpatient Hospital Stay: Admission: RE | Admit: 2017-05-03 | Payer: Self-pay | Source: Ambulatory Visit

## 2017-05-23 ENCOUNTER — Encounter: Payer: Self-pay | Admitting: Obstetrics & Gynecology

## 2017-06-02 ENCOUNTER — Ambulatory Visit (INDEPENDENT_AMBULATORY_CARE_PROVIDER_SITE_OTHER): Payer: Self-pay | Admitting: *Deleted

## 2017-06-02 DIAGNOSIS — R300 Dysuria: Secondary | ICD-10-CM

## 2017-06-02 DIAGNOSIS — Z113 Encounter for screening for infections with a predominantly sexual mode of transmission: Secondary | ICD-10-CM

## 2017-06-02 DIAGNOSIS — N898 Other specified noninflammatory disorders of vagina: Secondary | ICD-10-CM

## 2017-06-02 LAB — POCT URINALYSIS DIP (DEVICE)
Bilirubin Urine: NEGATIVE
Glucose, UA: NEGATIVE mg/dL
HGB URINE DIPSTICK: NEGATIVE
Ketones, ur: NEGATIVE mg/dL
LEUKOCYTES UA: NEGATIVE
Nitrite: NEGATIVE
PH: 7 (ref 5.0–8.0)
Protein, ur: NEGATIVE mg/dL
SPECIFIC GRAVITY, URINE: 1.01 (ref 1.005–1.030)
UROBILINOGEN UA: 0.2 mg/dL (ref 0.0–1.0)

## 2017-06-02 NOTE — Progress Notes (Signed)
Here for c/o thinks she has a uti. She c/o has to run to the bathroom a lot and having a little pain in her side. Informed her ua was negative. If symptoms get worse to get retested. Also c/o white vaginal discharge- no smell, was having vaginal itching. States took 7 day monistat.  States no vaginal itching ; but still having vaginal discharge. States would like to do self swab for BV because she has had that. We discussed that is not likely since she does not have an odor; but she would like to do self swab for bv, yeast, tric, gc.

## 2017-06-02 NOTE — Progress Notes (Signed)
I have reviewed the chart and agree with nursing staff's documentation of this patient's encounter.  Verita Schneiders, MD 06/02/2017 4:04 PM

## 2017-06-03 LAB — CERVICOVAGINAL ANCILLARY ONLY
Bacterial vaginitis: NEGATIVE
CANDIDA VAGINITIS: NEGATIVE
Chlamydia: NEGATIVE
Neisseria Gonorrhea: NEGATIVE
Trichomonas: NEGATIVE

## 2017-07-10 ENCOUNTER — Encounter: Payer: Self-pay | Admitting: Obstetrics & Gynecology

## 2017-07-11 ENCOUNTER — Encounter: Payer: Self-pay | Admitting: Obstetrics & Gynecology

## 2017-07-11 ENCOUNTER — Other Ambulatory Visit: Payer: Self-pay | Admitting: General Practice

## 2017-07-11 DIAGNOSIS — B9689 Other specified bacterial agents as the cause of diseases classified elsewhere: Secondary | ICD-10-CM

## 2017-07-11 DIAGNOSIS — N760 Acute vaginitis: Principal | ICD-10-CM

## 2017-07-11 MED ORDER — METRONIDAZOLE 500 MG PO TABS: 500.0000 mg | ORAL_TABLET | Freq: Two times a day (BID) | ORAL | 0 refills | Status: DC

## 2017-11-04 ENCOUNTER — Ambulatory Visit: Payer: Self-pay | Admitting: Obstetrics & Gynecology

## 2017-11-14 ENCOUNTER — Other Ambulatory Visit: Payer: Self-pay | Admitting: *Deleted

## 2017-11-14 ENCOUNTER — Encounter: Payer: Self-pay | Admitting: *Deleted

## 2017-11-14 DIAGNOSIS — B9689 Other specified bacterial agents as the cause of diseases classified elsewhere: Secondary | ICD-10-CM

## 2017-11-14 DIAGNOSIS — N760 Acute vaginitis: Principal | ICD-10-CM

## 2017-11-14 MED ORDER — METRONIDAZOLE 500 MG PO TABS: 500.0000 mg | ORAL_TABLET | Freq: Two times a day (BID) | ORAL | 0 refills | Status: DC

## 2017-12-16 ENCOUNTER — Ambulatory Visit (INDEPENDENT_AMBULATORY_CARE_PROVIDER_SITE_OTHER): Payer: Self-pay | Admitting: Obstetrics & Gynecology

## 2017-12-16 ENCOUNTER — Encounter: Payer: Self-pay | Admitting: Obstetrics & Gynecology

## 2017-12-16 VITALS — BP 131/83 | HR 71 | Wt 145.8 lb

## 2017-12-16 DIAGNOSIS — N731 Chronic parametritis and pelvic cellulitis: Secondary | ICD-10-CM

## 2017-12-16 DIAGNOSIS — N760 Acute vaginitis: Secondary | ICD-10-CM

## 2017-12-16 DIAGNOSIS — B9689 Other specified bacterial agents as the cause of diseases classified elsewhere: Secondary | ICD-10-CM

## 2017-12-16 DIAGNOSIS — N926 Irregular menstruation, unspecified: Secondary | ICD-10-CM

## 2017-12-16 MED ORDER — BORIC ACID CRYS: 600.0000 mg | CRYSTALS | Freq: Every day | 5 refills | Status: DC

## 2017-12-16 MED ORDER — METRONIDAZOLE 500 MG PO TABS: 500.0000 mg | ORAL_TABLET | Freq: Two times a day (BID) | ORAL | 5 refills | Status: DC

## 2017-12-16 MED ORDER — IBUPROFEN 800 MG PO TABS: 800.0000 mg | ORAL_TABLET | Freq: Three times a day (TID) | ORAL | 3 refills | Status: DC | PRN

## 2017-12-16 NOTE — Patient Instructions (Signed)
Return to clinic for any scheduled appointments or for any gynecologic concerns as needed.   

## 2017-12-16 NOTE — Progress Notes (Signed)
GYNECOLOGY OFFICE VISIT NOTE  History:  42 y.o. G0 here today for report of having an extra menstrual period that lasted a couple of days last month characterized by a lot of back pain. Also reports having recurrent BV episodes, especially after her menstrual periods.  Discharge is yellow, malodorous and itchy and lasts for several days.  Has these symptoms currently.  Also reports having mid abdominal right sided pain, worried about her chronic PID or recurrent ovarian cyst. She denies any fevers, GI symptoms, GU symptoms or other concerns.   Past Medical History:  Diagnosis Date  . Cellulitis    LEFT FOOT  . Chest cold 02/15/2017   PRESENTLY HAS CHEST COLD  . Chlamydia infection    As a teeenager  . Trichomonal vaginitis 01/2017    Past Surgical History:  Procedure Laterality Date  . DIAGNOSTIC LAPAROSCOPY  1990   REMOVED LEFT TUBE AND OVARY  . LAPAROSCOPIC LYSIS OF ADHESIONS N/A 02/22/2017   Procedure: LAPAROSCOPIC LYSIS OF ADHESIONS;  Surgeon: Osborne Oman, MD;  Location: [facility];  Service: Gynecology;  Laterality: N/A;    The following portions of the patient's history were reviewed and updated as appropriate: allergies, current medications, past family history, past medical history, past social history, past surgical history and problem list.   Health Maintenance:  Normal pap 2 years ago. Never had mammogram, has filled out mammogram scholarship.  Review of Systems:  Pertinent items noted in HPI and remainder of comprehensive ROS otherwise negative.  Objective:  Physical Exam BP 131/83   Pulse 71   Wt 145 lb 12.8 oz (66.1 kg)   BMI 26.67 kg/m  CONSTITUTIONAL: Well-developed, well-nourished female in no acute distress.  HEENT:  Normocephalic, atraumatic. External right and left ear normal. No scleral icterus.  NECK: Normal range of motion, supple, no masses noted on observation SKIN: No rash noted. Not diaphoretic. No erythema. No pallor. MUSCULOSKELETAL:  Normal range of motion. No edema noted. NEUROLOGIC: Alert and oriented to person, place, and time. Normal muscle tone coordination. No cranial nerve deficit noted. PSYCHIATRIC: Normal mood and affect. Normal behavior. Normal judgment and thought content. CARDIOVASCULAR: Normal heart rate noted RESPIRATORY: Effort and breath sounds normal, no problems with respiration noted ABDOMEN: No masses noted. No other overt distention noted. Mild right sided tenderness lateral to umbilicus, not near ovary.  PELVIC: Deferred   Assessment & Plan:  1. Recurrent BV (bacterial vaginosis) Will treat recurrent BV, unable to do extended metronidazole therapy due to cost for now. Alternative is boric acid therapy, this was ordered. Will monitor response.  - Boric Acid CRYS; Place 600 mg vaginally at bedtime. Use vaginally every night for two weeks then twice a week for 6 months.  Dispense: 500 g; Refill: 5 - metroNIDAZOLE (FLAGYL) 500 MG tablet; Take 1 tablet (500 mg total) by mouth 2 (two) times daily.  Dispense: 14 tablet; Refill: 5  2. Chronic PID (chronic pelvic inflammatory disease) 3. Abnormal menstrual period Pain could be due to chronic PID, not ovarian on examination findings. Ibuprofen ordered.  Not too concerned about menstrual period for now unless abnormal bleeding becomes persistent or heavier.  Ibuprofen will also help with this AUB.  - ibuprofen (ADVIL,MOTRIN) 800 MG tablet; Take 1 tablet (800 mg total) by mouth 3 (three) times daily with meals as needed for headache, moderate pain or cramping.  Dispense: 30 tablet; Refill: 3  Routine preventative health maintenance measures emphasized. Please refer to After Visit Summary for other counseling recommendations.  Return in about 2 months (around 02/16/2018) for Followup at [place].   Total face-to-face time with patient: 15 minutes.  Over 50% of encounter was spent on counseling and coordination of care.   Verita Schneiders, MD, [place] for [employer], [place]

## 2017-12-19 ENCOUNTER — Telehealth: Payer: Self-pay | Admitting: Radiology

## 2017-12-19 NOTE — Telephone Encounter (Signed)
Left message for patient to call [facility] to follow up with Dr Harolyn Rutherford in 6-8 weeks ( approx 01/30/17)

## 2018-05-16 ENCOUNTER — Ambulatory Visit: Payer: Self-pay

## 2018-06-01 ENCOUNTER — Ambulatory Visit: Payer: Self-pay

## 2018-06-08 ENCOUNTER — Other Ambulatory Visit: Payer: Self-pay

## 2018-06-08 ENCOUNTER — Ambulatory Visit: Payer: Self-pay

## 2018-06-14 ENCOUNTER — Other Ambulatory Visit: Payer: Self-pay

## 2018-06-14 ENCOUNTER — Ambulatory Visit (INDEPENDENT_AMBULATORY_CARE_PROVIDER_SITE_OTHER): Payer: 59 | Admitting: *Deleted

## 2018-06-14 VITALS — BP 117/74 | HR 67

## 2018-06-14 DIAGNOSIS — N898 Other specified noninflammatory disorders of vagina: Secondary | ICD-10-CM

## 2018-06-14 DIAGNOSIS — B9689 Other specified bacterial agents as the cause of diseases classified elsewhere: Secondary | ICD-10-CM | POA: Diagnosis not present

## 2018-06-14 DIAGNOSIS — N760 Acute vaginitis: Secondary | ICD-10-CM | POA: Diagnosis not present

## 2018-06-14 DIAGNOSIS — Z113 Encounter for screening for infections with a predominantly sexual mode of transmission: Secondary | ICD-10-CM

## 2018-06-14 NOTE — Progress Notes (Signed)
SUBJECTIVE:  43 y.o. female requesting to have STD testing done. Denies abnormal vaginal bleeding, discharge  or significant pelvic pain or fever. No UTI symptoms. Has a history of known exposure to STD.  No LMP recorded.  OBJECTIVE:  She appears well, afebrile.    PLAN:  GC, chlamydia, trichomonas, BVAG, CVAG probe sent to lab. Treatment: To be determined once lab results are received ROV prn if symptoms persist or worsen.

## 2018-06-16 LAB — CERVICOVAGINAL ANCILLARY ONLY
Bacterial vaginitis: POSITIVE — AB
Candida vaginitis: NEGATIVE
Chlamydia: NEGATIVE
Neisseria Gonorrhea: NEGATIVE
Trichomonas: NEGATIVE

## 2018-06-19 ENCOUNTER — Telehealth: Payer: Self-pay | Admitting: *Deleted

## 2018-06-19 MED ORDER — SECNIDAZOLE 2 G PO PACK: 1.0000 | PACK | Freq: Once | ORAL | 0 refills | Status: AC

## 2018-06-19 NOTE — Telephone Encounter (Signed)
-----   Message from Osborne Oman, MD sent at 06/19/2018  9:05 AM EDT ----- Patient has recurrent BV episodes.  Please offer her treatment with Tinidazole or Solosec (Solosec preferred, can get patient discount card).

## 2018-06-19 NOTE — Telephone Encounter (Signed)
Pt informed of results and recommendations. Pt willing to try recommended medication, will send into pharmacy

## 2018-06-19 NOTE — Telephone Encounter (Signed)
RX sent in

## 2018-06-19 NOTE — Progress Notes (Signed)
I have reviewed the chart and agree with nursing staff's documentation of this patient's encounter.  Verita Schneiders, MD 06/19/2018 8:59 AM

## 2018-06-22 ENCOUNTER — Ambulatory Visit: Payer: Self-pay

## 2018-09-13 ENCOUNTER — Ambulatory Visit (INDEPENDENT_AMBULATORY_CARE_PROVIDER_SITE_OTHER): Payer: 59 | Admitting: Advanced Practice Midwife

## 2018-09-13 ENCOUNTER — Encounter: Payer: Self-pay | Admitting: Advanced Practice Midwife

## 2018-09-13 ENCOUNTER — Other Ambulatory Visit: Payer: Self-pay

## 2018-09-13 VITALS — BP 118/79 | HR 67 | Wt 151.0 lb

## 2018-09-13 DIAGNOSIS — Z3202 Encounter for pregnancy test, result negative: Secondary | ICD-10-CM | POA: Diagnosis not present

## 2018-09-13 DIAGNOSIS — B9689 Other specified bacterial agents as the cause of diseases classified elsewhere: Secondary | ICD-10-CM | POA: Diagnosis not present

## 2018-09-13 DIAGNOSIS — R232 Flushing: Secondary | ICD-10-CM

## 2018-09-13 DIAGNOSIS — N760 Acute vaginitis: Secondary | ICD-10-CM | POA: Diagnosis not present

## 2018-09-13 DIAGNOSIS — N898 Other specified noninflammatory disorders of vagina: Secondary | ICD-10-CM

## 2018-09-13 DIAGNOSIS — N926 Irregular menstruation, unspecified: Secondary | ICD-10-CM

## 2018-09-13 DIAGNOSIS — Z113 Encounter for screening for infections with a predominantly sexual mode of transmission: Secondary | ICD-10-CM | POA: Diagnosis not present

## 2018-09-13 LAB — POCT URINE PREGNANCY: Preg Test, Ur: NEGATIVE

## 2018-09-13 NOTE — Progress Notes (Signed)
Has not a period since July  Has questions about her solosec RX

## 2018-09-13 NOTE — Patient Instructions (Signed)

## 2018-09-13 NOTE — Progress Notes (Signed)
GYNECOLOGY PROBLEM CARE ENCOUNTER NOTE  History:     Julie Cooley is a 43 y.o. G0P0000 female here with questions regarding her prescribed Solosec and recent hot flashes.  Current complaints: patient reports new onset hot flashes at night that are "new but not that bad". She is managing them by moving to a cooler part of her house, wearing lightweight clothing and drinking cold glasses of water. She declines further intervention at this time.     Patient was seen for recurrent BV in June 2020 and prescribed Solosec. Patient verbalizes concern that "killing off bacteria" will make her more susceptible to pathogens including COVID so she never started the medication. She reports her symptoms are unchanged since originally reported and she has the medication at home. She just wanted confirmation before initiating treatment.   Denies abnormal vaginal bleeding, change in severity of discharge, pelvic pain, problems with intercourse or other gynecologic concerns.    Gynecologic History No LMP recorded. Amenorrheic for two months Contraception: none Last Pap: remote per pt Last mammogram: unknown per pt.   Obstetric History OB History  Gravida Para Term Preterm AB Living  0 0 0 0 0 0  SAB TAB Ectopic Multiple Live Births  0 0 0 0 0    Past Medical History:  Diagnosis Date  . Cellulitis    LEFT FOOT  . Chest cold 02/15/2017   PRESENTLY HAS CHEST COLD  . Chlamydia infection    As a teeenager  . Trichomonal vaginitis 01/2017    Past Surgical History:  Procedure Laterality Date  . DIAGNOSTIC LAPAROSCOPY  1990   REMOVED LEFT TUBE AND OVARY  . LAPAROSCOPIC LYSIS OF ADHESIONS N/A 02/22/2017   Procedure: LAPAROSCOPIC LYSIS OF ADHESIONS;  Surgeon: Osborne Oman, MD;  Location: [facility];  Service: Gynecology;  Laterality: N/A;    Current Outpatient Medications on File Prior to Visit  Medication Sig Dispense Refill  . aspirin EC 81 MG tablet Take 81 mg by mouth daily.    . Boric  Acid CRYS Place 600 mg vaginally at bedtime. Use vaginally every night for two weeks then twice a week for 6 months. 500 g 5  . ibuprofen (ADVIL,MOTRIN) 800 MG tablet Take 1 tablet (800 mg total) by mouth 3 (three) times daily with meals as needed for headache, moderate pain or cramping. 30 tablet 3  . metroNIDAZOLE (FLAGYL) 500 MG tablet Take 1 tablet (500 mg total) by mouth 2 (two) times daily. (Patient not taking: Reported on 09/13/2018) 14 tablet 5   No current facility-administered medications on file prior to visit.     Allergies  Allergen Reactions  . Demerol [Meperidine] Nausea And Vomiting  . Morphine And Related Nausea And Vomiting    Social History:  reports that she has been smoking cigarettes. She has a 20.00 pack-year smoking history. She has never used smokeless tobacco. She reports current drug use. Drug: Marijuana. She reports that she does not drink alcohol.  History reviewed. No pertinent family history.  The following portions of the patient's history were reviewed and updated as appropriate: allergies, current medications, past family history, past medical history, past social history, past surgical history and problem list.  Review of Systems Pertinent items noted in HPI and remainder of comprehensive ROS otherwise negative.  Physical Exam:  BP 118/79   Pulse 67   Wt 151 lb (68.5 kg)   BMI 27.62 kg/m  CONSTITUTIONAL: Well-developed, well-nourished female in no acute distress.  HENT:  Normocephalic,  atraumatic, External right and left ear normal. Oropharynx is clear and moist EYES: Conjunctivae and EOM are normal. Pupils are equal, round, and reactive to light. No scleral icterus.  NECK: Normal range of motion, supple, no masses.  Normal thyroid.  SKIN: Skin is warm and dry. No rash noted. Not diaphoretic. No erythema. No pallor. MUSCULOSKELETAL: Normal range of motion. No tenderness.  No cyanosis, clubbing, or edema.  2+ distal pulses. NEUROLOGIC: Alert and  oriented to person, place, and time. Normal reflexes, muscle tone coordination. No cranial nerve deficit noted. PSYCHIATRIC: Normal mood and affect. Normal behavior. Normal judgment and thought content. CARDIOVASCULAR: Normal heart rate noted, regular rhythm RESPIRATORY: Clear to auscultation bilaterally. Effort and breath sounds normal, no problems with respiration noted. BREASTS: Symmetric in size. No masses, skin changes, nipple drainage, or lymphadenopathy. ABDOMEN: Soft, normal bowel sounds, no distention noted.  No tenderness, rebound or guarding.  PELVIC: Normal appearing external genitalia; normal appearing vaginal mucosa and cervix.  No abnormal discharge noted.    Assessment and Plan:    1. Abnormal menstrual period - POCT urine pregnancy - Cervicovaginal ancillary only( [place])  2. Hot flashes - Currently mild and successfully managed with behavioral interventions - Briefly discussed SSRIs for management of moderate/more intrusive hot flashes. PRN  3. Recurrent BV - Reviewed instructions for treatment per Dr. Harolyn Rutherford - Reassured Solosec will not compromise her immune system  Remote from pap smear. Schedule pap and mammogram at earliest convenience  Routine preventative health maintenance measures emphasized. Please refer to After Visit Summary for other counseling recommendations.      Mallie Snooks, MSN, CNM Certified Nurse Midwife, [employer] for [employer], [facility] 09/13/18 11:30 AM

## 2018-09-15 LAB — CERVICOVAGINAL ANCILLARY ONLY
Bacterial vaginitis: NEGATIVE
Candida vaginitis: NEGATIVE
Chlamydia: NEGATIVE
Neisseria Gonorrhea: NEGATIVE
Trichomonas: NEGATIVE

## 2018-09-27 ENCOUNTER — Other Ambulatory Visit: Payer: Self-pay

## 2018-09-27 ENCOUNTER — Ambulatory Visit: Payer: 59 | Admitting: Advanced Practice Midwife

## 2018-09-27 ENCOUNTER — Emergency Department
Admission: EM | Admit: 2018-09-27 | Discharge: 2018-09-27 | Disposition: A | Discharge status: Home / Self Care | Payer: 59 | Attending: Emergency Medicine | Admitting: Emergency Medicine

## 2018-09-27 ENCOUNTER — Emergency Department: Payer: 59

## 2018-09-27 DIAGNOSIS — M898X1 Other specified disorders of bone, shoulder: Secondary | ICD-10-CM | POA: Diagnosis present

## 2018-09-27 DIAGNOSIS — Z7982 Long term (current) use of aspirin: Secondary | ICD-10-CM | POA: Diagnosis not present

## 2018-09-27 DIAGNOSIS — Z79899 Other long term (current) drug therapy: Secondary | ICD-10-CM | POA: Diagnosis not present

## 2018-09-27 DIAGNOSIS — F17210 Nicotine dependence, cigarettes, uncomplicated: Secondary | ICD-10-CM | POA: Insufficient documentation

## 2018-09-27 MED ORDER — IBUPROFEN 600 MG PO TABS: 600.0000 mg | ORAL_TABLET | Freq: Three times a day (TID) | ORAL | 0 refills | Status: DC | PRN

## 2018-09-27 MED ORDER — LIDOCAINE 5 % EX PTCH
1.0000 | MEDICATED_PATCH | CUTANEOUS | Status: DC
  Administered 2018-09-27: 1 via TRANSDERMAL
  Filled 2018-09-27: qty 1

## 2018-09-27 MED ORDER — CYCLOBENZAPRINE HCL 10 MG PO TABS: 10.0000 mg | ORAL_TABLET | Freq: Three times a day (TID) | ORAL | 0 refills | Status: DC | PRN

## 2018-09-27 NOTE — ED Provider Notes (Signed)
[facility] Emergency Department Provider Note   ____________________________________________   First MD Initiated Contact with Patient 09/27/18 [phone]     (approximate)  I have reviewed the triage vital signs and the nursing notes.   HISTORY  Chief Complaint Back Pain    HPI Julie Cooley is a 43 y.o. female patient complains of right scapular pain secondary to fall 2 days ago.  Patient state pain increased with adduction overhead reaching.  Patient denies loss of sensation.  Patient rates the pain as a 10/10.  Patient described pain is "achy".  No palliative measure for complaint.  Patient is right-hand dominant.         Past Medical History:  Diagnosis Date  . Cellulitis    LEFT FOOT  . Chest cold 02/15/2017   PRESENTLY HAS CHEST COLD  . Chlamydia infection    As a teeenager  . Trichomonal vaginitis 01/2017    Patient Active Problem List   Diagnosis Date Noted  . Chronic PID (chronic pelvic inflammatory disease)     Past Surgical History:  Procedure Laterality Date  . DIAGNOSTIC LAPAROSCOPY  1990   REMOVED LEFT TUBE AND OVARY  . LAPAROSCOPIC LYSIS OF ADHESIONS N/A 02/22/2017   Procedure: LAPAROSCOPIC LYSIS OF ADHESIONS;  Surgeon: Osborne Oman, MD;  Location: [facility];  Service: Gynecology;  Laterality: N/A;    Prior to Admission medications   Medication Sig Start Date End Date Taking? Authorizing Provider  aspirin EC 81 MG tablet Take 81 mg by mouth daily.    [provider]  Boric Acid CRYS Place 600 mg vaginally at bedtime. Use vaginally every night for two weeks then twice a week for 6 months. 12/16/17   Anyanwu, Sallyanne Havers, MD  cyclobenzaprine (FLEXERIL) 10 MG tablet Take 1 tablet (10 mg total) by mouth 3 (three) times daily as needed. 09/27/18   Sable Feil, PA-C  ibuprofen (ADVIL) 600 MG tablet Take 1 tablet (600 mg total) by mouth every 8 (eight) hours as needed. 09/27/18   Sable Feil, PA-C  ibuprofen  (ADVIL,MOTRIN) 800 MG tablet Take 1 tablet (800 mg total) by mouth 3 (three) times daily with meals as needed for headache, moderate pain or cramping. 12/16/17   Anyanwu, Sallyanne Havers, MD  metroNIDAZOLE (FLAGYL) 500 MG tablet Take 1 tablet (500 mg total) by mouth 2 (two) times daily. Patient not taking: Reported on 09/13/2018 12/16/17   Osborne Oman, MD    Allergies Demerol [meperidine] and Morphine and related  No family history on file.  Social History Social History   Tobacco Use  . Smoking status: Current Some Day Smoker    Packs/day: 0.50    Years: 40.00    Pack years: 20.00    Types: Cigarettes  . Smokeless tobacco: Never Used  Substance Use Topics  . Alcohol use: No  . Drug use: Yes    Types: Marijuana    Comment: OCCASIONAL    Review of Systems Constitutional: No fever/chills Eyes: No visual changes. ENT: No sore throat. Cardiovascular: Denies chest pain. Respiratory: Denies shortness of breath. Gastrointestinal: No abdominal pain.  No nausea, no vomiting.  No diarrhea.  No constipation. Genitourinary: Negative for dysuria. Musculoskeletal: Right scapular pain. Skin: Negative for rash. Neurological: Negative for headaches, focal weakness or numbness. Allergic/Immunilogical: Medrol and morphine. ____________________________________________   PHYSICAL EXAM:  VITAL SIGNS: ED Triage Vitals  Enc Vitals Group     BP 09/27/18 0845 118/78     Pulse Rate  09/27/18 0845 76     Resp 09/27/18 0845 18     Temp 09/27/18 0845 99.1 F (37.3 C)     Temp Source 09/27/18 0845 Oral     SpO2 09/27/18 0845 99 %     Weight 09/27/18 0846 151 lb (68.5 kg)     Height 09/27/18 0846 5\' 3"  (1.6 m)     Head Circumference --      Peak Flow --      Pain Score 09/27/18 0846 10     Pain Loc --      Pain Edu? --      Excl. in [place]? --    Constitutional: Alert and oriented. Well appearing and in no acute distress. Cardiovascular: Normal rate, regular rhythm. Grossly normal heart sounds.   Good peripheral circulation. Respiratory: Normal respiratory effort.  No retractions. Lungs CTAB. Musculoskeletal: No obvious deformity to the right shoulder.  Patient decreased range of motion with abduction and overhead reaching..  Strength is  3/5 with resistance. Neurologic:  Normal speech and language. No gross focal neurologic deficits are appreciated. No gait instability. Skin:  Skin is warm, dry and intact. No rash noted.  No abrasion or ecchymosis. Psychiatric: Mood and affect are normal. Speech and behavior are normal.  ____________________________________________   LABS (all labs ordered are listed, but only abnormal results are displayed)  Labs Reviewed - No data to display ____________________________________________  EKG   ____________________________________________  RADIOLOGY  ED MD interpretation:    Official radiology report(s): Dg Shoulder Right  Result Date: 09/27/2018 CLINICAL DATA:  Right shoulder pain since a fall 09/24/2018. Initial encounter. EXAM: RIGHT SHOULDER - 2+ VIEW COMPARISON:  None. FINDINGS: There is no evidence of fracture or dislocation. There is no evidence of arthropathy or other focal bone abnormality. Soft tissues are unremarkable. IMPRESSION: Negative exam. Electronically Signed   By: Inge Rise M.D.   On: 09/27/2018 10:07    ____________________________________________   PROCEDURES  Procedure(s) performed (including Critical Care):  Procedures   ____________________________________________   INITIAL IMPRESSION / ASSESSMENT AND PLAN / ED COURSE  As part of my medical decision making, I reviewed the following data within the [place] was evaluated in Emergency Department on 09/27/2018 for the symptoms described in the history of present illness. She was evaluated in the context of the global COVID-19 pandemic, which necessitated consideration that the patient might be at risk for  infection with the SARS-CoV-2 virus that causes COVID-19. Institutional protocols and algorithms that pertain to the evaluation of patients at risk for COVID-19 are in a state of rapid change based on information released by regulatory bodies including the CDC and federal and state organizations. These policies and algorithms were followed during the patient's care in the ED.  Right scapular pain secondary to contusion.  Discussed neck x-ray findings with patient.  Patient given discharge care instruction advised take medication as directed.  [facility]      ____________________________________________   FINAL CLINICAL IMPRESSION(S) / ED DIAGNOSES  Final diagnoses:  Pain of right scapula     ED Discharge Orders         Ordered    cyclobenzaprine (FLEXERIL) 10 MG tablet  3 times daily PRN     09/27/18 1023    ibuprofen (ADVIL) 600 MG tablet  Every 8 hours PRN     09/27/18 1023  Note:  This document was prepared using Dragon voice recognition software and may include unintentional dictation errors.    Sable Feil, PA-C 09/27/18 1027    Earleen Newport, MD 09/27/18 1335

## 2018-09-27 NOTE — ED Notes (Signed)
Pt reports upper back and right shoulder pain at 10/10. She is AOx4, vss and she does not show any signs of respiratory distress.

## 2018-09-27 NOTE — Discharge Instructions (Signed)
Follow discharge care instruction using heat instead of ice at this time.  Take medication as directed.

## 2018-09-27 NOTE — ED Notes (Signed)
Pt is being discharged to home. Pt is Aox4, VSS, pt does not show any signs of distress. AVS/RX was given and explained to the pt and she verbalized understanding of all information.    

## 2018-09-27 NOTE — ED Triage Notes (Signed)
Pt c/o upper back pain since Saturday night, states she fell. When asked the pt states I dont quit remember,

## 2018-09-29 ENCOUNTER — Emergency Department (HOSPITAL_COMMUNITY): Payer: 59

## 2018-09-29 ENCOUNTER — Encounter (HOSPITAL_COMMUNITY): Payer: Self-pay | Admitting: Emergency Medicine

## 2018-09-29 ENCOUNTER — Emergency Department (HOSPITAL_COMMUNITY)
Admission: EM | Admit: 2018-09-29 | Discharge: 2018-09-29 | Disposition: A | Discharge status: Home / Self Care | Payer: 59 | Attending: Emergency Medicine | Admitting: Emergency Medicine

## 2018-09-29 DIAGNOSIS — M542 Cervicalgia: Secondary | ICD-10-CM | POA: Insufficient documentation

## 2018-09-29 DIAGNOSIS — R202 Paresthesia of skin: Secondary | ICD-10-CM

## 2018-09-29 DIAGNOSIS — F17210 Nicotine dependence, cigarettes, uncomplicated: Secondary | ICD-10-CM | POA: Diagnosis not present

## 2018-09-29 DIAGNOSIS — Z7982 Long term (current) use of aspirin: Secondary | ICD-10-CM | POA: Diagnosis not present

## 2018-09-29 DIAGNOSIS — M25511 Pain in right shoulder: Secondary | ICD-10-CM | POA: Diagnosis not present

## 2018-09-29 DIAGNOSIS — R200 Anesthesia of skin: Secondary | ICD-10-CM | POA: Diagnosis not present

## 2018-09-29 DIAGNOSIS — Z79899 Other long term (current) drug therapy: Secondary | ICD-10-CM | POA: Diagnosis not present

## 2018-09-29 DIAGNOSIS — W19XXXA Unspecified fall, initial encounter: Secondary | ICD-10-CM

## 2018-09-29 MED ORDER — NAPROXEN 250 MG PO TABS
500.0000 mg | ORAL_TABLET | Freq: Once | ORAL | Status: AC
  Administered 2018-09-29: 17:00:00 500 mg via ORAL
  Filled 2018-09-29: qty 2

## 2018-09-29 MED ORDER — NAPROXEN 500 MG PO TABS: 500.0000 mg | ORAL_TABLET | Freq: Two times a day (BID) | ORAL | 0 refills | Status: DC

## 2018-09-29 MED ORDER — DICLOFENAC SODIUM 1 % TD GEL: 4.0000 g | Freq: Four times a day (QID) | TRANSDERMAL | 0 refills | Status: DC

## 2018-09-29 MED ORDER — PREDNISONE 20 MG PO TABS
60.0000 mg | ORAL_TABLET | Freq: Once | ORAL | Status: AC
  Administered 2018-09-29: 17:00:00 60 mg via ORAL
  Filled 2018-09-29: qty 3

## 2018-09-29 MED ORDER — METHOCARBAMOL 500 MG PO TABS
1000.0000 mg | ORAL_TABLET | Freq: Once | ORAL | Status: AC
  Administered 2018-09-29: 1000 mg via ORAL
  Filled 2018-09-29: qty 2

## 2018-09-29 MED ORDER — METHYLPREDNISOLONE 4 MG PO TBPK: ORAL_TABLET | ORAL | 0 refills | Status: DC

## 2018-09-29 MED ORDER — ACETAMINOPHEN 500 MG PO TABS
1000.0000 mg | ORAL_TABLET | Freq: Once | ORAL | Status: AC
  Administered 2018-09-29: 17:00:00 1000 mg via ORAL
  Filled 2018-09-29: qty 2

## 2018-09-29 NOTE — ED Notes (Signed)
Fell Saturday and went to [facility] to be evaluated and they reported no broken areas, but patient is reporting more pain that radiates up to her shoulder to her right elbow and also down her right side.

## 2018-09-29 NOTE — ED Triage Notes (Signed)
Pt to ER for right shoulder pain onset Saturday when she fell while intoxicated. Reports has tightness in right shoulder and neck. Pain is worse with movement. NAD at this time.

## 2018-09-29 NOTE — Discharge Instructions (Addendum)
You were in the emergency department for right-sided neck, back, shoulder pain with tingling to her hand.  CT scan of cervical spine showed disc height loss at C 5 and C6 which may or may not have been from the fall or trauma.  This could also have been chronically throughout the years.  Sometimes this predisposes you to nerve impingement, joint inflammation and pain.  Thoracic spine x-rays did not reveal any fractured bones.  Given your symptoms and physical exam findings, I suspect you have a soft tissue injury including possible muscular spasm, tightness, ligament strain or nerve inflammation.   We will treat your inflammation with the following medication regimen: Methylprednisone taper Robaxin 500 mg every 8 hours x 5 days Naproxen 500 mg every 12 hours plus Acetaminophen (tylenol) 1000 mg every 8 hours for the next 5 days Heating pad as needed Over the counter lidocaine patches (salonpas) can be helpful Topical voltaren gel used to massage, can then cover with heat pad  Avoid any exacerbating activities for the next 48 hours.  After 48 hours, start doing light back range of motion exercises, massage and stretches to help with stiffness and pain.   Return for worsening pain, fever, headache, complete loss of sensation or weakness to one side of your body  If symptoms do not resolve or persist, you need specialist evaluation (neurosurgery).

## 2018-09-29 NOTE — ED Provider Notes (Signed)
[facility] EMERGENCY DEPARTMENT Provider Note   CSN: [id number] Arrival date & time: 09/29/18  1216     History   Chief Complaint Chief Complaint  Patient presents with  . Shoulder Injury    HPI Julie Cooley is a 43 y.o. female presents to the ER for evaluation of pain to her right neck that radiates to the right shoulder, right thoracic back.  There is associated loss of sensation, tingling to the right hand but specifically worse in the right index and middle fingers.  Admits that last weekend she was at a party and intoxicated, unfortunately fell.  This was a witnessed fall by husband who is at bedside.  She landed mostly on her right elbow.  Has been states she did not hit her head or pass out.  She woke up the next morning feeling "locked up" in her shoulder.  She stretched it out and was able to go to work.  The pain and tightness slightly improved throughout the day.  Symptoms did not improve so on Wednesday she went to outside ER for evaluation where they did a shoulder x-ray and told her that everything was normal and they discharged her with muscle relaxers.  States she is not "medicine person" so she has not taken the muscle relaxer.  She has been taking Excedrin as needed without relief of the symptoms.  States her symptoms are worse in the morning when she tries to lift her head up or when she moves her shoulder around.  If she stretches it out and moves around the symptoms slightly improved.  There is no left-sided neck pain or back pain or upper extremity symptoms.  She denies any headache.  No frank neck stiffness but states her neck hurts when she moves it.  No previous cervical injuries or surgeries.  She denies any extremity swelling, redness or warmth.  No other physical injuries from the fall.  No chest wall pain.     HPI  Past Medical History:  Diagnosis Date  . Cellulitis    LEFT FOOT  . Chest cold 02/15/2017   PRESENTLY HAS CHEST COLD  .  Chlamydia infection    As a teeenager  . Trichomonal vaginitis 01/2017    Patient Active Problem List   Diagnosis Date Noted  . Chronic PID (chronic pelvic inflammatory disease)     Past Surgical History:  Procedure Laterality Date  . DIAGNOSTIC LAPAROSCOPY  1990   REMOVED LEFT TUBE AND OVARY  . LAPAROSCOPIC LYSIS OF ADHESIONS N/A 02/22/2017   Procedure: LAPAROSCOPIC LYSIS OF ADHESIONS;  Surgeon: Osborne Oman, MD;  Location: [facility];  Service: Gynecology;  Laterality: N/A;     OB History    Gravida  0   Para  0   Term  0   Preterm  0   AB  0   Living  0     SAB  0   TAB  0   Ectopic  0   Multiple  0   Live Births  0            Home Medications    Prior to Admission medications   Medication Sig Start Date End Date Taking? Authorizing Provider  aspirin EC 81 MG tablet Take 81 mg by mouth daily.    [provider]  Boric Acid [place] 600 mg vaginally at bedtime. Use vaginally every night for two weeks then twice a week for 6 months. 12/16/17  Anyanwu, Sallyanne Havers, MD  cyclobenzaprine (FLEXERIL) 10 MG tablet Take 1 tablet (10 mg total) by mouth 3 (three) times daily as needed. 09/27/18   Sable Feil, PA-C  diclofenac sodium (VOLTAREN) 1 % GEL Apply 4 g topically 4 (four) times daily. 09/29/18   Kinnie Feil, PA-C  ibuprofen (ADVIL) 600 MG tablet Take 1 tablet (600 mg total) by mouth every 8 (eight) hours as needed. 09/27/18   Sable Feil, PA-C  ibuprofen (ADVIL,MOTRIN) 800 MG tablet Take 1 tablet (800 mg total) by mouth 3 (three) times daily with meals as needed for headache, moderate pain or cramping. 12/16/17   Anyanwu, Sallyanne Havers, MD  methylPREDNISolone (MEDROL DOSEPAK) 4 MG TBPK tablet Take entire taper pack until completed 09/29/18   Kinnie Feil, PA-C  metroNIDAZOLE (FLAGYL) 500 MG tablet Take 1 tablet (500 mg total) by mouth 2 (two) times daily. Patient not taking: Reported on 09/13/2018 12/16/17   Anyanwu, Sallyanne Havers, MD  naproxen  (NAPROSYN) 500 MG tablet Take 1 tablet (500 mg total) by mouth 2 (two) times daily. 09/29/18   Kinnie Feil, PA-C    Family History History reviewed. No pertinent family history.  Social History Social History   Tobacco Use  . Smoking status: Current Some Day Smoker    Packs/day: 0.50    Years: 40.00    Pack years: 20.00    Types: Cigarettes  . Smokeless tobacco: Never Used  Substance Use Topics  . Alcohol use: No  . Drug use: Yes    Types: Marijuana    Comment: OCCASIONAL     Allergies   Demerol [meperidine] and Morphine and related   Review of Systems Review of Systems  Musculoskeletal: Positive for arthralgias, back pain, myalgias and neck pain.  Neurological:       Loss of sensation, tingling  All other systems reviewed and are negative.    Physical Exam Updated Vital Signs BP 132/65   Pulse 71   Temp 97.9 F (36.6 C) (Oral)   Resp 15   SpO2 98%   Physical Exam Constitutional:      General: She is not in acute distress.    Appearance: She is well-developed.  HENT:     Head: Normocephalic and atraumatic.     Right Ear: External ear normal.     Left Ear: External ear normal.     Nose: Nose normal.  Eyes:     General: Lids are normal.  Neck:     Musculoskeletal: Muscular tenderness present.     Thyroid: No thyromegaly.     Trachea: Trachea and phonation normal.     Comments: Right paraspinal muscular tenderness. Right upper/middle trapezius tenderness without increased tone.  Pain with active flexion and left neck bend. Positive Spurling's (tingling in right finger tips). Negative Adson's. No neck rigidity or meningismus. No midline cervical spinous process or step offs. Trachea midline. Cardiovascular:     Rate and Rhythm: Normal rate and regular rhythm.     Comments: 2+ radial pulses bilaterally. Good cap refill to fingers bilaterally.  Pulmonary:     Effort: Pulmonary effort is normal.     Breath sounds: Normal breath sounds.   Musculoskeletal:     Cervical back: She exhibits tenderness.     Comments: RIGHT SHOULDER: Skin over shoulder is normal. No edema, warmth, fluctuance. No point tenderness to sternum, anterior chest wall, scapula, clavicle, AC or [place] joints, deltoid, biceps tendon. Full active ROM of shoulder without crepitus. Pain with  full abduction and flexion.  Positive Hawkin's and Neer's test. Negative Speed's and Yergason's test. Negative drop arm test.   RIGHT ELBOW: Skin normal. No edema or effusion. No focal bony tenderness. Full ROM without pain.  RIGHT WRIST: no carpal tunnel tenderness, edema. Negative Tinnel's and Phalen's. Full ROM without pain. No scaphoid tenderness.   Skin:    General: Skin is warm and dry.     Capillary Refill: Capillary refill takes less than 2 seconds.     Comments: No signs of trauma or rash to neck or upper extremities   Neurological:     Mental Status: She is alert and oriented to person, place, and time.     Comments: Sensation to light touch intact in upper extremities 5/5 strength with hand grip and finger abduction bilaterally  Psychiatric:        Behavior: Behavior normal.        Thought Content: Thought content normal.      ED Treatments / Results  Labs (all labs ordered are listed, but only abnormal results are displayed) Labs Reviewed - No data to display  EKG None  Radiology Dg Thoracic Spine W/swimmers  Result Date: 09/29/2018 CLINICAL DATA:  Fall, neck pain EXAM: THORACIC SPINE - 3 VIEWS COMPARISON:  None. FINDINGS: There is no evidence of thoracic spine fracture. Alignment is normal. No other significant bone abnormalities are identified. IMPRESSION: Negative. Electronically Signed   By: Prudencio Pair M.D.   On: 09/29/2018 15:51   Ct Cervical Spine Wo Contrast  Result Date: 09/29/2018 CLINICAL DATA:  Fall 1 week ago no right-sided neck pain EXAM: CT CERVICAL SPINE WITHOUT CONTRAST TECHNIQUE: Multidetector CT imaging of the cervical spine was  performed without intravenous contrast. Multiplanar CT image reconstructions were also generated. COMPARISON:  None. FINDINGS: Alignment: There is straightening of the normal cervical lordosis. Skull base and vertebrae: Visualized skull base is intact. No atlanto-occipital dissociation. The vertebral body heights are well maintained. No fracture or pathologic osseous lesion seen. Soft tissues and spinal canal: The visualized paraspinal soft tissues are unremarkable. No prevertebral soft tissue swelling is seen. The spinal canal is grossly unremarkable, no large epidural collection or significant canal narrowing. Disc levels: Disc height loss with disc osteophyte complexes most notable at the C5-C6. No significant canal or neural foraminal narrowing. Upper chest: The lung apices are clear. Thoracic inlet is within normal limits. Other: None IMPRESSION: No acute fracture or malalignment of the spine. Electronically Signed   By: Prudencio Pair M.D.   On: 09/29/2018 17:35    Procedures Procedures (including critical care time)  Medications Ordered in ED Medications  acetaminophen (TYLENOL) tablet 1,000 mg (1,000 mg Oral Given 09/29/18 1713)  naproxen (NAPROSYN) tablet 500 mg (500 mg Oral Given 09/29/18 1713)  methocarbamol (ROBAXIN) tablet 1,000 mg (1,000 mg Oral Given 09/29/18 1713)  predniSONE (DELTASONE) tablet 60 mg (60 mg Oral Given 09/29/18 1713)     Initial Impression / Assessment and Plan / ED Course  I have reviewed the triage vital signs and the nursing notes.  Pertinent labs & imaging results that were available during my care of the patient were reviewed by me and considered in my medical decision making (see chart for details).  43 year old is here with right-sided pain to neck, thoracic spine, shoulder with some discomfort in the arm, tingling to the index and middle fingers.  Traumatic.  EMR reviewed and I reviewed the ER visit previously, x-rays reviewed did not show any traumatic injury.   Given  her trauma, exam I suspect soft tissue injury of the cervical spine, neck possibly spasm, muscular strain.  She also has radicular symptoms in the C7 distribution.  Given mechanism of fall and otherwise intact neuro vascular exam to the upper extremities I doubt vascular traumatic injury in the neck.  She has no midline CT spine tenderness to suggest acute traumatic spine injury.  Given her persistent symptoms more than 1 week, we will obtain CT cervical spine, thoracic spine images.  I suspect this will show no traumatic bony abnormalities.  Imaging reviewed by me and radiologist.  There is minimal disc height loss at C5/6 but no significant stenosis.  Otherwise no traumatic injuries.  Patient was given medicines here and had improvement in her symptoms.  Will DC with treatment for soft tissue injury and likely spasm, strain, cervical radiculopathy.  Recommended stretching, massage.  Follow-up with neurosurgery for persistent symptoms to return precautions given.  Patient is comfortable with this.  Final Clinical Impressions(s) / ED Diagnoses   Final diagnoses:  Neck pain on right side  Numbness and tingling in right hand    ED Discharge Orders         Ordered    methylPREDNISolone (MEDROL DOSEPAK) 4 MG TBPK tablet     09/29/18 1745    naproxen (NAPROSYN) 500 MG tablet  2 times daily     09/29/18 1745    diclofenac sodium (VOLTAREN) 1 % GEL  4 times daily     09/29/18 1754           Arlean Hopping 09/29/18 Will Bonnet, MD 10/01/18 1104

## 2018-09-29 NOTE — ED Notes (Signed)
Was seen at [facility] and had imaging done. Was given flexeril but states does not want to take it.

## 2018-09-29 NOTE — ED Notes (Signed)
Pt verbalized understanding of discharge paperwork, prescriptions and follow-up care 

## 2018-10-10 NOTE — Progress Notes (Deleted)
Pap smear? Mammogram? Sti testing?

## 2018-10-11 ENCOUNTER — Ambulatory Visit: Payer: 59 | Admitting: Advanced Practice Midwife

## 2019-01-09 ENCOUNTER — Telehealth: Payer: Self-pay

## 2019-01-09 NOTE — Telephone Encounter (Signed)
Patient called stating she has thrush in her mouth. Patient states recently had covid and was given something and she thinks she has thrush. Advised patient that she should consult with her primary care. Patient states she doesn't have a PCP so I advised she could see an urgent care. Julie Alu RN

## 2019-03-29 ENCOUNTER — Ambulatory Visit: Payer: 59 | Admitting: Obstetrics and Gynecology

## 2019-03-29 ENCOUNTER — Encounter: Payer: Self-pay | Admitting: Obstetrics and Gynecology

## 2019-03-29 NOTE — Progress Notes (Signed)
Patient did not keep her GYN appointment for 03/29/2019.  Durene Romans MD Attending Center for [employer] farm manager)

## 2019-04-19 ENCOUNTER — Ambulatory Visit: Payer: 59 | Admitting: Obstetrics & Gynecology

## 2019-04-19 ENCOUNTER — Other Ambulatory Visit: Payer: Self-pay

## 2019-04-19 VITALS — BP 136/77 | HR 86 | Ht 63.0 in | Wt 153.0 lb

## 2019-04-19 DIAGNOSIS — N924 Excessive bleeding in the premenopausal period: Secondary | ICD-10-CM | POA: Diagnosis not present

## 2019-04-19 DIAGNOSIS — Z1231 Encounter for screening mammogram for malignant neoplasm of breast: Secondary | ICD-10-CM

## 2019-04-19 DIAGNOSIS — N898 Other specified noninflammatory disorders of vagina: Secondary | ICD-10-CM | POA: Diagnosis not present

## 2019-04-19 NOTE — Patient Instructions (Signed)

## 2019-04-19 NOTE — Progress Notes (Signed)
GYNECOLOGY OFFICE VISIT NOTE  History:   SELDA VISTA is a 44 y.o. G0 here today for evaluation of periodic thick white vaginal discharge. Not irritating.  Also had a period in 12/2018, then next one in 03/2019 that was a little heavier with cramps.  No prolonged heavy bleeding, no symptoms of anemia.  Also endorses periodic hot flashes, night sweats that are not debilitating, alleviated by going to cool environments. Not pregnant.  She denies any current abnormal vaginal bleeding, pelvic pain or other concerns.    Past Medical History:  Diagnosis Date  . Cellulitis    LEFT FOOT  . Chest cold 02/15/2017   PRESENTLY HAS CHEST COLD  . Chlamydia infection    As a teeenager  . Trichomonal vaginitis 01/2017    Past Surgical History:  Procedure Laterality Date  . DIAGNOSTIC LAPAROSCOPY  1990   REMOVED LEFT TUBE AND OVARY  . LAPAROSCOPIC LYSIS OF ADHESIONS N/A 02/22/2017   Procedure: LAPAROSCOPIC LYSIS OF ADHESIONS;  Surgeon: Osborne Oman, MD;  Location: [facility];  Service: Gynecology;  Laterality: N/A;    The following portions of the patient's history were reviewed and updated as appropriate: allergies, current medications, past family history, past medical history, past social history, past surgical history and problem list.   Health Maintenance:  Normal pap over four years ago.  Never had a mammogram.  Review of Systems:  Pertinent items noted in HPI and remainder of comprehensive ROS otherwise negative.  Physical Exam:  BP 136/77   Pulse 86   Ht 5\' 3"  (1.6 m)   Wt 153 lb (69.4 kg)   BMI 27.10 kg/m  CONSTITUTIONAL: Well-developed, well-nourished female in no acute distress.  HEENT:  Normocephalic, atraumatic. External right and left ear normal. No scleral icterus.  NECK: Normal range of motion, supple, no masses noted on observation SKIN: No rash noted. Not diaphoretic. No erythema. No pallor. MUSCULOSKELETAL: Normal range of motion. No edema noted. NEUROLOGIC: Alert  and oriented to person, place, and time. Normal muscle tone coordination. No cranial nerve deficit noted. PSYCHIATRIC: Normal mood and affect. Normal behavior. Normal judgment and thought content. CARDIOVASCULAR: Normal heart rate noted RESPIRATORY: Effort and breath sounds normal, no problems with respiration noted ABDOMEN: Nontender.  No masses noted. No other overt distention noted.   PELVIC: Normal appearing external genitalia; normal urethral meatus; normal appearing vaginal mucosa and cervix.  Pap smear done. Scant white thin nonodorous discharge noted, testing sample obtained.  Normal uterine size, no other palpable masses, no uterine or adnexal tenderness. Performed in the presence of a chaperone.     Assessment and Plan:      1. Perimenopausal bleeding irregularity Her pattern and symptoms consistent with perimenopause.  No heavy or prolonged bleeding. She was advised to let us know if her symptoms become debilitating.   Bleeding precautions reviewed. Pap smear done given her bleeding irregularity and also she is overdue.  - Cytology - PAP - Cervicovaginal ancillary only( [place])  2. Vaginal discharge - Cervicovaginal ancillary only( [place]) done, will follow up results and manage accordingly.  3. Breast cancer screening by mammogram - MM 3D SCREEN BREAST BILATERAL; Future Mammogram scheduled.  Routine preventative health maintenance measures emphasized. Please refer to After Visit Summary for other counseling recommendations.   Return for any gynecologic concerns.    Total face-to-face time with patient: 15 minutes.  Over 50% of encounter was spent on counseling and coordination of care.   Verita Schneiders, MD, Cherlynn June  [place], Product/process development scientist for [employer], [place]

## 2019-04-23 LAB — CERVICOVAGINAL ANCILLARY ONLY
Bacterial Vaginitis (gardnerella): NEGATIVE
Candida Glabrata: NEGATIVE
Candida Vaginitis: NEGATIVE
Chlamydia: NEGATIVE
Comment: NEGATIVE
Comment: NEGATIVE
Comment: NEGATIVE
Comment: NEGATIVE
Comment: NEGATIVE
Comment: NORMAL
Neisseria Gonorrhea: NEGATIVE
Trichomonas: NEGATIVE

## 2019-04-24 ENCOUNTER — Encounter: Payer: Self-pay | Admitting: *Deleted

## 2019-04-24 LAB — CYTOLOGY - PAP
Comment: NEGATIVE
Diagnosis: NEGATIVE
High risk HPV: NEGATIVE

## 2019-05-31 ENCOUNTER — Ambulatory Visit: Payer: 59

## 2019-06-06 ENCOUNTER — Ambulatory Visit: Payer: 59

## 2019-06-13 ENCOUNTER — Ambulatory Visit: Payer: 59

## 2020-03-06 ENCOUNTER — Encounter: Payer: Self-pay | Admitting: Radiology

## 2020-05-26 ENCOUNTER — Telehealth: Payer: Self-pay | Admitting: *Deleted

## 2020-05-26 NOTE — Telephone Encounter (Signed)
[facility] faxed over request to refill boric acid 600mg  vag caps, refill approved plus one additional

## 2020-05-29 ENCOUNTER — Other Ambulatory Visit: Payer: Self-pay | Admitting: *Deleted

## 2020-05-29 DIAGNOSIS — B9689 Other specified bacterial agents as the cause of diseases classified elsewhere: Secondary | ICD-10-CM

## 2020-05-29 MED ORDER — BORIC ACID CRYS: 600.0000 mg | CRYSTALS | Freq: Every day | 5 refills | Status: DC

## 2020-06-19 ENCOUNTER — Ambulatory Visit: Payer: 59 | Admitting: Obstetrics and Gynecology

## 2020-08-07 ENCOUNTER — Ambulatory Visit: Payer: 59 | Admitting: Obstetrics and Gynecology

## 2020-12-25 ENCOUNTER — Ambulatory Visit: Payer: 59

## 2021-08-25 IMAGING — CT CT CERVICAL SPINE W/O CM
3 of 4 series · 12 of 33 positions shown, 14 images · non-contrast
Comparison: None.

CLINICAL DATA: Fall 1 week ago no right-sided neck pain

EXAM:
CT CERVICAL SPINE WITHOUT CONTRAST
TECHNIQUE: Multidetector CT imaging of the cervical spine was performed without
intravenous contrast. Multiplanar CT image reconstructions were also
generated.

[Series 4: c_spine 2.0 st · axial · 0.33mm/px · z∈[-147,-41]mm · 4 of 81 slices shown, 5 images]
[im 14/81  soft-tissue]
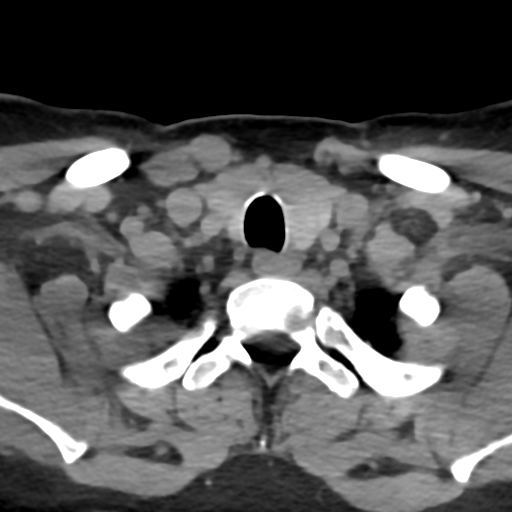
[im 14/81  bone]
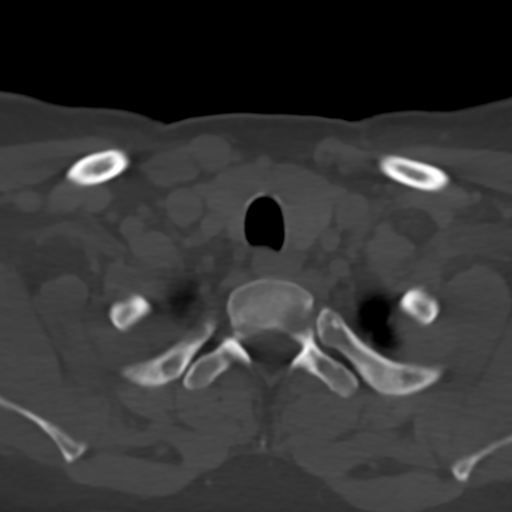
[im 27/81  bone]
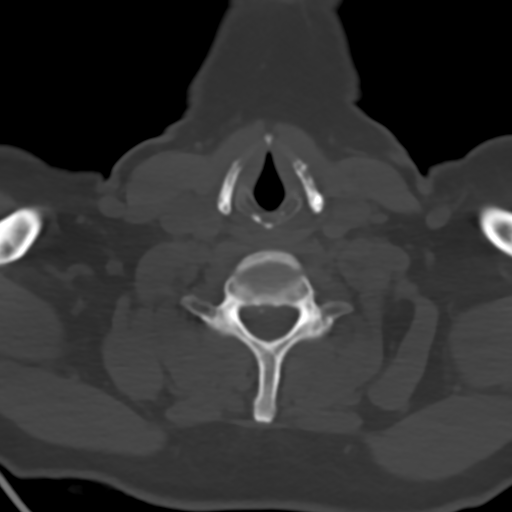
[im 54/81  bone]
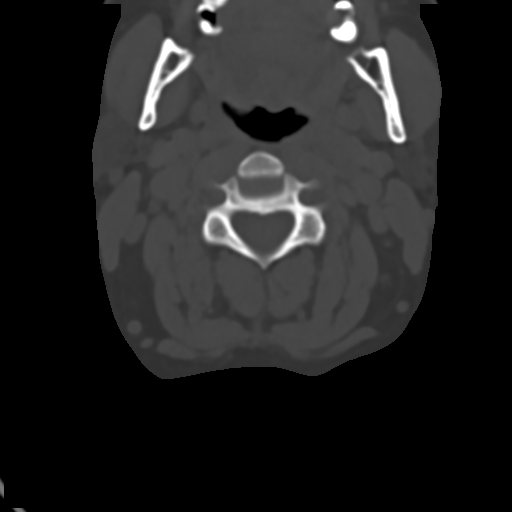
[im 67/81  bone]
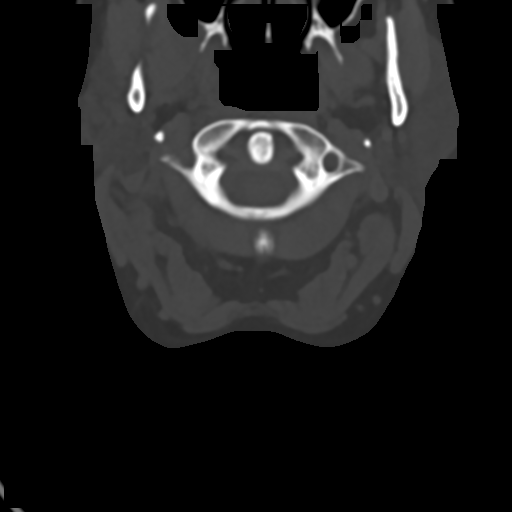

[Series 6: c_spine 2.0 sag bone · sagittal · 0.23mm/px · 5 of 51 slices shown, 6 images]
[im 17/51  bone]
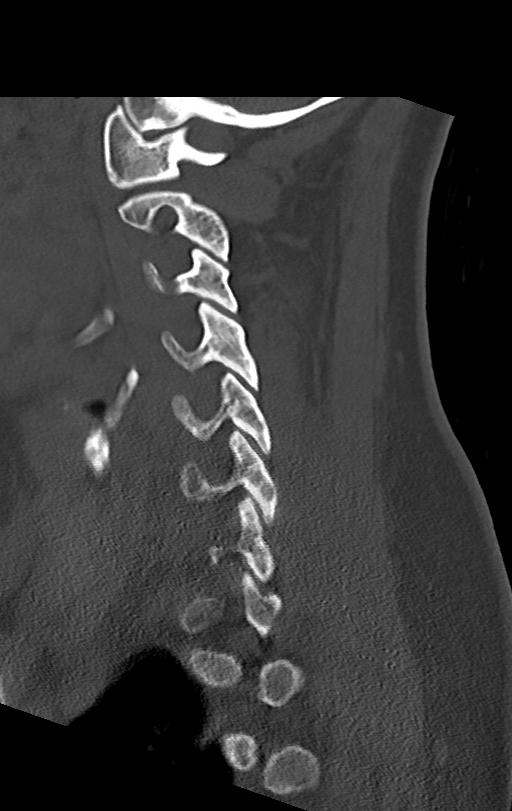
[im 21/51  bone]
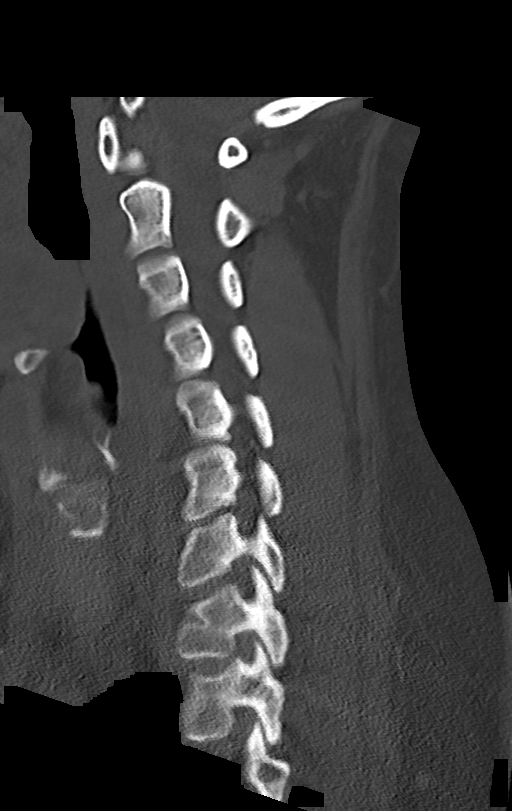
[im 26/51  soft-tissue]
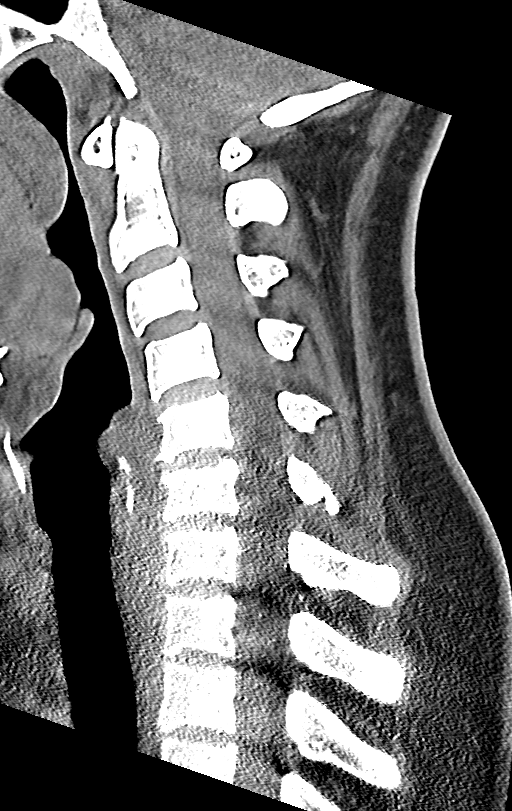
[im 26/51  bone]
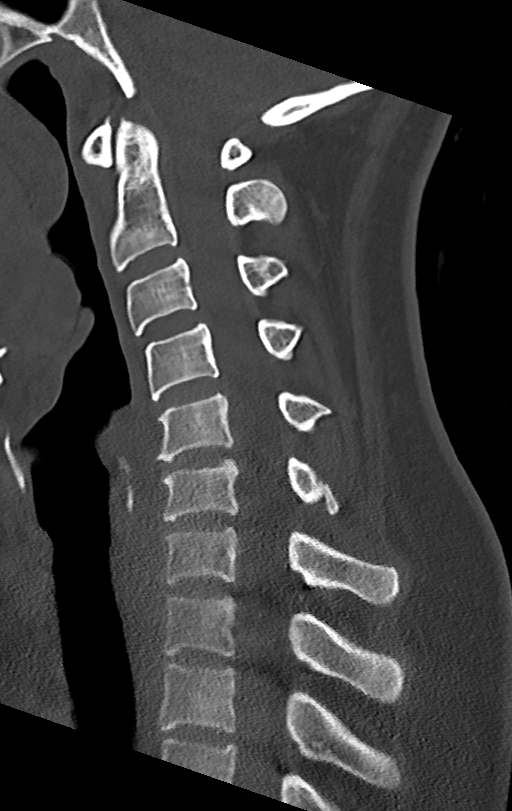
[im 30/51  bone]
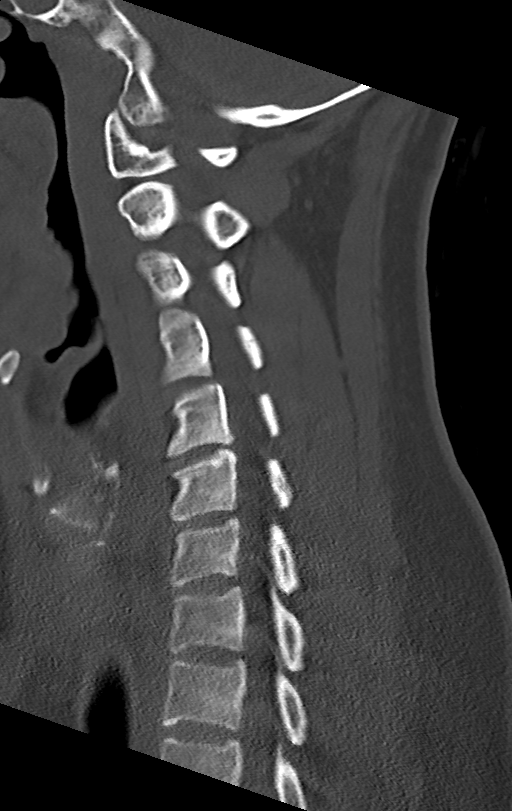
[im 34/51  bone]
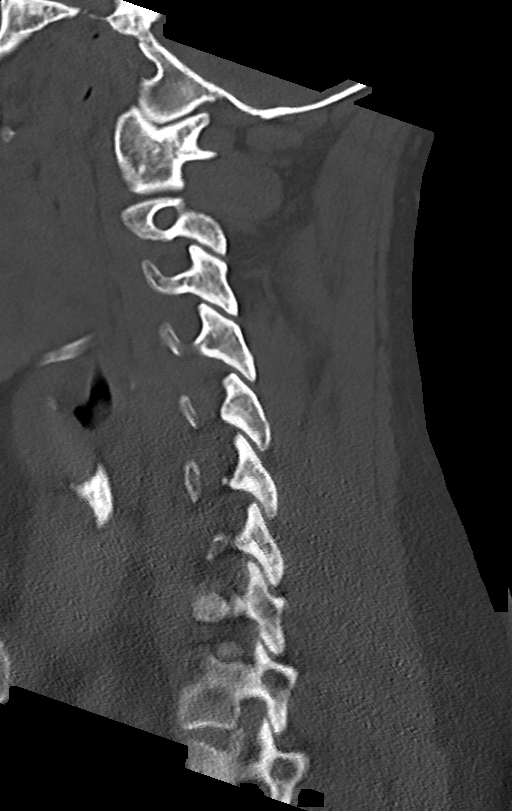

[Series 7: c_spine 2.0 cor bone · coronal · 0.24mm/px · 3 of 61 slices shown]
[im 13/61  bone]
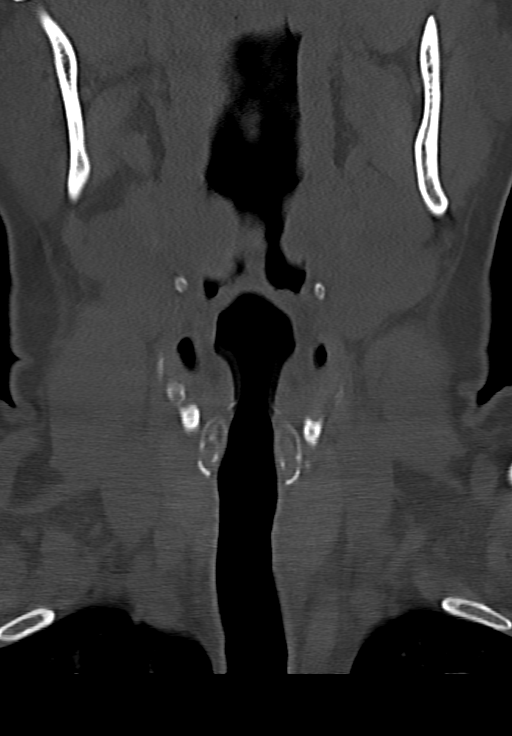
[im 25/61  bone]
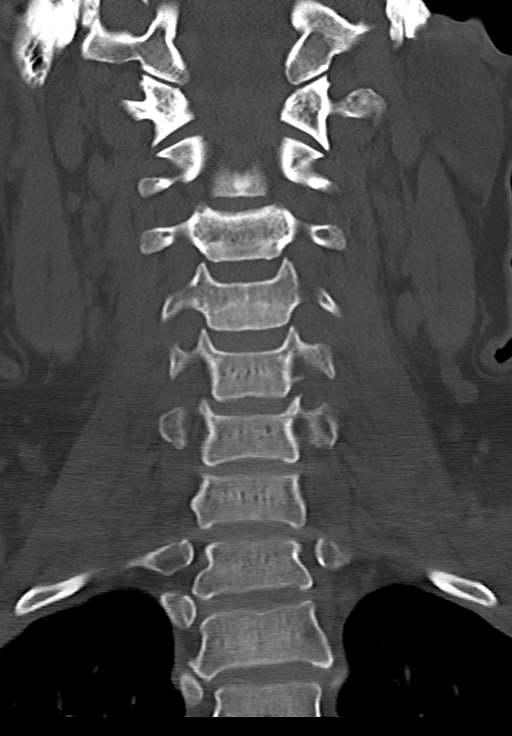
[im 37/61  bone]
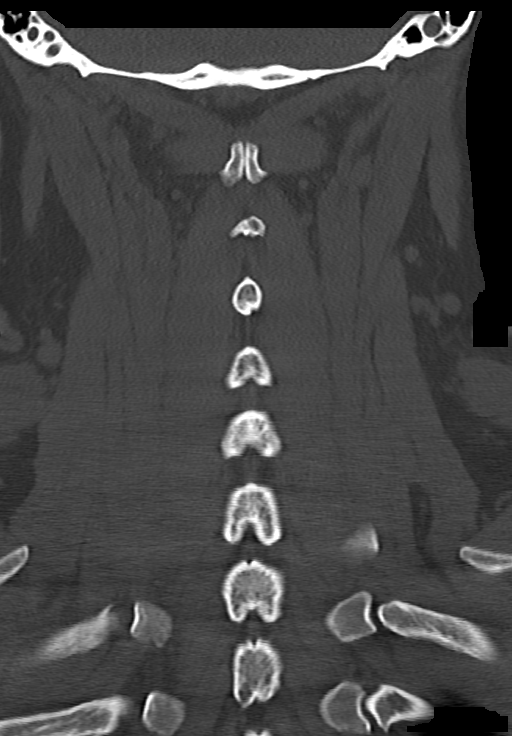

[12 of 33 positions shown; findings below may reference images not displayed]

FINDINGS: Alignment: There is straightening of the normal cervical lordosis.

Skull base and vertebrae: Visualized skull base is intact. No
atlanto-occipital dissociation. The vertebral body heights are well
maintained. No fracture or pathologic osseous lesion seen.

Soft tissues and spinal canal: The visualized paraspinal soft
tissues are unremarkable. No prevertebral soft tissue swelling is
seen. The spinal canal is grossly unremarkable, no large epidural
collection or significant canal narrowing.

Disc levels: Disc height loss with disc osteophyte complexes most
notable at the C5-C6. No significant canal or neural foraminal
narrowing.

Upper chest: The lung apices are clear. Thoracic inlet is within
normal limits.

Other: None
IMPRESSION: No acute fracture or malalignment of the spine.

## 2021-09-03 ENCOUNTER — Other Ambulatory Visit: Payer: Self-pay | Admitting: *Deleted

## 2021-09-03 DIAGNOSIS — B9689 Other specified bacterial agents as the cause of diseases classified elsewhere: Secondary | ICD-10-CM

## 2021-09-03 MED ORDER — BORIC ACID CRYS: 600.0000 mg | CRYSTALS | Freq: Every day | 1 refills | Status: DC

## 2021-10-27 ENCOUNTER — Ambulatory Visit: Payer: Self-pay | Admitting: Obstetrics & Gynecology

## 2022-02-23 ENCOUNTER — Other Ambulatory Visit (HOSPITAL_COMMUNITY)
Admission: RE | Admit: 2022-02-23 | Discharge: 2022-02-23 | Disposition: A | Discharge status: Home / Self Care | Payer: Self-pay | Source: Ambulatory Visit | Attending: Obstetrics and Gynecology | Admitting: Obstetrics and Gynecology

## 2022-02-23 ENCOUNTER — Encounter: Payer: Self-pay | Admitting: Obstetrics and Gynecology

## 2022-02-23 ENCOUNTER — Ambulatory Visit (INDEPENDENT_AMBULATORY_CARE_PROVIDER_SITE_OTHER): Payer: Self-pay | Admitting: Obstetrics and Gynecology

## 2022-02-23 VITALS — BP 129/74 | HR 65 | Wt 167.0 lb

## 2022-02-23 DIAGNOSIS — Z01419 Encounter for gynecological examination (general) (routine) without abnormal findings: Secondary | ICD-10-CM

## 2022-02-23 DIAGNOSIS — Z0000 Encounter for general adult medical examination without abnormal findings: Secondary | ICD-10-CM | POA: Insufficient documentation

## 2022-02-23 DIAGNOSIS — M549 Dorsalgia, unspecified: Secondary | ICD-10-CM

## 2022-02-23 LAB — POCT URINALYSIS DIPSTICK

## 2022-02-23 LAB — POCT URINE PREGNANCY: Preg Test, Ur: NEGATIVE

## 2022-02-23 NOTE — Progress Notes (Signed)
CC: Pt is Self pay Right sided back pain x 1 weeks  Denies any dysuria, frequency Hx of BV, denies any symptoms

## 2022-02-23 NOTE — Progress Notes (Signed)
Obstetrics and Gynecology Annual Patient Evaluation  Appointment Date: 02/23/2022  OBGYN Clinic: [facility]  Primary Care Provider: Patient, No Pcp Per  Referring Provider: No ref. provider found  Chief Complaint:  Chief Complaint  Patient presents with   Annual Exam    Right sided back pain x 1 week  Denies any dysuria, frequency, urgency      History of Present Illness: Julie Cooley is a 47 y.o. G0 (LMP: 2 years ago), seen for the above chief complaint.   Right back pain for the past week that is improving. No prior history and can't think of any inciting event, GI s/s and no lower urinary tract s/s and no GYN s/s.   Review of Systems: Pertinent items noted in HPI and remainder of comprehensive ROS otherwise negative.    Past Medical History:  Past Medical History:  Diagnosis Date   Cellulitis    LEFT FOOT   Chest cold 02/15/2017   PRESENTLY HAS CHEST COLD   Chlamydia infection    As a teeenager   Chronic PID (chronic pelvic inflammatory disease)    Seen during laparoscopy on 02/22/17   Trichomonal vaginitis 01/2017    Past Surgical History:  Past Surgical History:  Procedure Laterality Date   DIAGNOSTIC LAPAROSCOPY  1990   REMOVED LEFT TUBE AND OVARY   LAPAROSCOPIC LYSIS OF ADHESIONS N/A 02/22/2017   Procedure: LAPAROSCOPIC LYSIS OF ADHESIONS;  Surgeon: Osborne Oman, MD;  Location: [facility];  Service: Gynecology;  Laterality: N/A;    Past Obstetrical History:  OB History  Gravida Para Term Preterm AB Living  0 0 0 0 0 0  SAB IAB Ectopic Multiple Live Births  0 0 0 0 0    Past Gynecological History: As per HPI. History of Pap Smear(s): Yes.   Last pap April 2021, which was cytology and HPV negative History of STI(s): Yes.   History of HRT use: No.  Social History:  Social History   Socioeconomic History   Marital status: Significant Other    Spouse name: Not on file   Number of children: Not on file   Years of  education: Not on file   Highest education level: Not on file  Occupational History   Not on file  Tobacco Use   Smoking status: Some Days    Packs/day: 0.50    Years: 40.00    Total pack years: 20.00    Types: Cigarettes   Smokeless tobacco: Never  Vaping Use   Vaping Use: Never used  Substance and Sexual Activity   Alcohol use: No   Drug use: Yes    Types: Marijuana    Comment: OCCASIONAL   Sexual activity: Yes    Birth control/protection: None  Other Topics Concern   Not on file  Social History Narrative   Not on file   Social Determinants of Health   Financial Resource Strain: Not on file  Food Insecurity: Not on file  Transportation Needs: Not on file  Physical Activity: Not on file  Stress: Not on file  Social Connections: Not on file  Intimate Partner Violence: Not on file    Family History: History reviewed. No pertinent family history.  Medications None  Allergies Demerol [meperidine] and Morphine and related   Physical Exam:  BP 129/74   Pulse 65   Wt 167 lb (75.8 kg)   LMP  (LMP Unknown)   BMI 29.58 kg/m  Body mass index is 29.58 kg/m. General  appearance: Well nourished, well developed female in no acute distress.  Neck:  Supple, normal appearance, and no thyromegaly  Back: no CVAT Respiratory:  Normal respiratory effort Abdomen: no masses, hernias; diffusely non tender to palpation, non distended Breasts: patient declines to have breast exam. Neuro/Psych:  Normal mood and affect.  Skin:  Warm and dry.   Pelvic exam: declined  Laboratory: UA dip +ketones  Radiology: none  Assessment: patient stable  Plan:  1. Right-sided back pain, unspecified back location, unspecified chronicity No e/o infection. Recommend exp management and to let us know if s/s persist after a month and can refer - POCT urine pregnancy - POCT Urinalysis Dipstick  2. Well woman exam (no gynecological exam) Routine care. Pt desires STI testing. Front desk to  refer to [facility] for mammo screening - RPR+HBsAg+HCVAb+... - Cervicovaginal ancillary only( [place])  RTC PRN  Durene Romans MD Attending Center for [employer] [facility])

## 2022-02-24 LAB — RPR+HBSAG+HCVAB+...
HIV Screen 4th Generation wRfx: NONREACTIVE
Hep C Virus Ab: NONREACTIVE
Hepatitis B Surface Ag: NEGATIVE
RPR Ser Ql: NONREACTIVE

## 2022-02-26 ENCOUNTER — Encounter: Payer: Self-pay | Admitting: Obstetrics and Gynecology

## 2022-02-26 LAB — CERVICOVAGINAL ANCILLARY ONLY

## 2022-03-02 ENCOUNTER — Other Ambulatory Visit (HOSPITAL_COMMUNITY)
Admission: RE | Admit: 2022-03-02 | Discharge: 2022-03-02 | Disposition: A | Discharge status: Home / Self Care | Payer: Self-pay | Source: Ambulatory Visit | Attending: Obstetrics & Gynecology | Admitting: Obstetrics & Gynecology

## 2022-03-02 ENCOUNTER — Ambulatory Visit: Payer: Self-pay

## 2022-03-02 DIAGNOSIS — M549 Dorsalgia, unspecified: Secondary | ICD-10-CM | POA: Insufficient documentation

## 2022-03-02 NOTE — Progress Notes (Signed)
SUBJECTIVE:  47 y.o. female complains of Right sided back pain and was seen on 2.13.24, lab sample needed to be recollected, doing so today.   Denies abnormal vaginal bleeding or significant pelvic pain or fever. No UTI symptoms. Denies history of known exposure to STD.  No LMP recorded (lmp unknown). Patient is postmenopausal.  OBJECTIVE:  She appears well, afebrile.  ASSESSMENT:  Vaginal Discharge  Vaginal Odor   PLAN:  GC, chlamydia, trichomonas, BVAG, CVAG probe sent to lab. Treatment: To be determined once lab results are received ROV prn if symptoms persist or worsen.   Donnie Aho, CMA

## 2022-03-03 LAB — CERVICOVAGINAL ANCILLARY ONLY
Bacterial Vaginitis (gardnerella): POSITIVE — AB
Candida Glabrata: NEGATIVE
Candida Vaginitis: NEGATIVE
Chlamydia: NEGATIVE
Comment: NEGATIVE
Comment: NEGATIVE
Comment: NEGATIVE
Comment: NEGATIVE
Comment: NEGATIVE
Comment: NORMAL
Neisseria Gonorrhea: NEGATIVE
Trichomonas: NEGATIVE

## 2022-04-29 ENCOUNTER — Emergency Department
Admission: EM | Admit: 2022-04-29 | Discharge: 2022-04-29 | Disposition: A | Discharge status: Home / Self Care | Payer: Self-pay | Attending: Emergency Medicine | Admitting: Emergency Medicine

## 2022-04-29 ENCOUNTER — Emergency Department: Payer: Self-pay

## 2022-04-29 ENCOUNTER — Other Ambulatory Visit: Payer: Self-pay

## 2022-04-29 DIAGNOSIS — J040 Acute laryngitis: Secondary | ICD-10-CM | POA: Insufficient documentation

## 2022-04-29 DIAGNOSIS — J069 Acute upper respiratory infection, unspecified: Secondary | ICD-10-CM | POA: Insufficient documentation

## 2022-04-29 DIAGNOSIS — Z1152 Encounter for screening for COVID-19: Secondary | ICD-10-CM | POA: Insufficient documentation

## 2022-04-29 LAB — SARS CORONAVIRUS 2 BY RT PCR: SARS Coronavirus 2 by RT PCR: NEGATIVE

## 2022-04-29 MED ORDER — BENZONATATE 100 MG PO CAPS: ORAL_CAPSULE | ORAL | 0 refills | Status: DC

## 2022-04-29 MED ORDER — PREDNISONE 20 MG PO TABS: 40.0000 mg | ORAL_TABLET | Freq: Every day | ORAL | 0 refills | Status: AC

## 2022-04-29 NOTE — Discharge Instructions (Signed)
Take the prescription meds as directed.  Take OTC Delsym for additional cough relief.  Plenty of fluids to prevent dehydration.  Consider salt water gargles and warm drinks like honey lemon tea and coffee.

## 2022-04-29 NOTE — ED Triage Notes (Signed)
Pt to ED for flu sx for couple weeks.

## 2022-04-29 NOTE — ED Provider Notes (Signed)
[facility] Emergency Department Provider Note     Event Date/Time   First MD Initiated Contact with Patient 04/29/22 1421     (approximate)   History   Cough   HPI  Julie Cooley is a 47 y.o. female with noncontributory medical history, presents to the ED for evaluation of flulike symptoms.  Patient reports 2 weeks of intermittent cough, congestion, body aches, and malaise.  Physical Exam   Triage Vital Signs: ED Triage Vitals  Enc Vitals Group     BP 04/29/22 1252 131/72     Pulse Rate 04/29/22 1251 80     Resp 04/29/22 1251 18     Temp 04/29/22 1251 98 F (36.7 C)     Temp Source 04/29/22 1251 Oral     SpO2 04/29/22 1251 96 %     Weight 04/29/22 1250 164 lb (74.4 kg)     Height 04/29/22 1250  (1.6 m)     Head Circumference --      Peak Flow --      Pain Score 04/29/22 1250 0     Pain Loc --      Pain Edu? --      Excl. in GC? --     Most recent vital signs: Vitals:   04/29/22 1251 04/29/22 1252  BP:  131/72  Pulse: 80   Resp: 18   Temp: 98 F (36.7 C)   SpO2: 96%     General Awake, no distress.  AD HEENT NCAT. PERRL. EOMI.N No rhinorrhea. Mucous membranes are moist.  Uvula is midline tonsils are flat.  No oropharyngeal lesions appreciated. CV:  Good peripheral perfusion.  RESP:  Normal effort.  ABD:  No distention.   ED Results / Procedures / Treatments   Labs (all labs ordered are listed, but only abnormal results are displayed) Labs Reviewed  SARS CORONAVIRUS 2 BY RT PCR     EKG   RADIOLOGY  I personally viewed and evaluated these images as part of my medical decision making, as well as reviewing the written report by the radiologist.  ED Provider Interpretation: no acute   DG Chest 2 View  Result Date: 04/29/2022 CLINICAL DATA:  Cough for 2 weeks. EXAM: CHEST - 2 VIEW COMPARISON:  None Available. FINDINGS: The cardiomediastinal contours are normal. The lungs are clear. Pulmonary vasculature is normal.  No consolidation, pleural effusion, or pneumothorax. No acute osseous abnormalities are seen. IMPRESSION: Negative radiographs of the chest. Electronically Signed   By: Narda Rutherford M.D.   On: 04/29/2022 14:43     PROCEDURES:  Critical Care performed: No  Procedures   MEDICATIONS ORDERED IN ED: Medications - No data to display   IMPRESSION / MDM / ASSESSMENT AND PLAN / ED COURSE  I reviewed the triage vital signs and the nursing notes.                              Differential diagnosis includes, but is not limited to, COVID, flu, RSV, AOM, CAP, bronchitis, viral URI  Patient's presentation is most consistent with acute complicated illness / injury requiring diagnostic workup.  Patient's diagnosis is consistent with laryngitis and viral URI. Patient will be discharged home with prescriptions for Tessalon Perles and prednisone. Patient is to follow up with [facility] as needed or otherwise directed. Patient is given ED precautions to return to the ED for any worsening or new symptoms.  Clinical Course as of 04/29/22 1452  Thu Apr 29, 2022  1438 DG Chest 2 View [MH]    Clinical Course User Index [MH] Romeo Apple, [place] A, PA-C    FINAL CLINICAL IMPRESSION(S) / ED DIAGNOSES   Final diagnoses:  Viral URI with cough  Laryngitis     Rx / DC Orders   ED Discharge Orders          Ordered    benzonatate (TESSALON PERLES) 100 MG capsule        04/29/22 1450    predniSONE (DELTASONE) 20 MG tablet  Daily with breakfast        04/29/22 1450             Note:  This document was prepared using Dragon voice recognition software and may include unintentional dictation errors.    Lissa Hoard, PA-C 04/29/22 1452    Jene Every, MD 04/29/22 1515

## 2023-02-24 ENCOUNTER — Other Ambulatory Visit: Payer: Self-pay | Admitting: *Deleted

## 2023-02-24 ENCOUNTER — Encounter: Payer: Self-pay | Admitting: Obstetrics & Gynecology

## 2023-02-24 DIAGNOSIS — B9689 Other specified bacterial agents as the cause of diseases classified elsewhere: Secondary | ICD-10-CM

## 2023-02-24 MED ORDER — BORIC ACID CRYS: 600.0000 mg | CRYSTALS | Freq: Every day | 1 refills | Status: DC

## 2023-04-18 ENCOUNTER — Ambulatory Visit: Payer: Self-pay | Admitting: Obstetrics & Gynecology

## 2023-04-18 ENCOUNTER — Other Ambulatory Visit (HOSPITAL_COMMUNITY)
Admission: RE | Admit: 2023-04-18 | Discharge: 2023-04-18 | Disposition: A | Discharge status: Home / Self Care | Source: Ambulatory Visit | Attending: Obstetrics & Gynecology | Admitting: Obstetrics & Gynecology

## 2023-04-18 ENCOUNTER — Encounter: Payer: Self-pay | Admitting: Obstetrics & Gynecology

## 2023-04-18 VITALS — BP 118/78 | HR 65

## 2023-04-18 DIAGNOSIS — Z01419 Encounter for gynecological examination (general) (routine) without abnormal findings: Secondary | ICD-10-CM | POA: Insufficient documentation

## 2023-04-18 DIAGNOSIS — Z1339 Encounter for screening examination for other mental health and behavioral disorders: Secondary | ICD-10-CM | POA: Diagnosis not present

## 2023-04-18 DIAGNOSIS — Z1211 Encounter for screening for malignant neoplasm of colon: Secondary | ICD-10-CM | POA: Diagnosis not present

## 2023-04-18 DIAGNOSIS — Z113 Encounter for screening for infections with a predominantly sexual mode of transmission: Secondary | ICD-10-CM

## 2023-04-18 DIAGNOSIS — Z1231 Encounter for screening mammogram for malignant neoplasm of breast: Secondary | ICD-10-CM | POA: Diagnosis not present

## 2023-04-18 NOTE — Progress Notes (Signed)
 GYNECOLOGY ANNUAL PREVENTATIVE CARE ENCOUNTER NOTE  History:     Julie Cooley is a 48 y.o. [id number] female here for a routine annual gynecologic exam.  Current complaints: None.   Denies abnormal vaginal bleeding, discharge, pelvic pain, problems with intercourse or other gynecologic concerns.   Of note, permission was obtained from patient to have a medical student present during this encounter and to participate in the physical examination.    Gynecologic History No LMP recorded (lmp unknown). Patient is postmenopausal. Contraception: none Last Pap: 04/24/2019. Result was normal with negative HPV Last Mammogram: Never; planning to obtain one this year. Last Colonoscopy: Never; plans to get cologuard mailed to her.  Obstetric History OB History  Gravida Para Term Preterm AB Living  0 0 0 0 0 0  SAB IAB Ectopic Multiple Live Births  0 0 0 0 0    Past Medical History:  Diagnosis Date   Cellulitis    LEFT FOOT   Chest cold 02/15/2017   PRESENTLY HAS CHEST COLD   Chlamydia infection    As a teeenager   Chronic PID (chronic pelvic inflammatory disease)    Seen during laparoscopy on 02/22/17   Trichomonal vaginitis 01/2017    Past Surgical History:  Procedure Laterality Date   DIAGNOSTIC LAPAROSCOPY  1990   REMOVED LEFT TUBE AND OVARY   LAPAROSCOPIC LYSIS OF ADHESIONS N/A 02/22/2017   Procedure: LAPAROSCOPIC LYSIS OF ADHESIONS;  Surgeon: Tereso Newcomer, MD;  Location: [facility];  Service: Gynecology;  Laterality: N/A;    No current outpatient medications on file prior to visit.   No current facility-administered medications on file prior to visit.    Allergies  Allergen Reactions   Demerol [Meperidine] Nausea And Vomiting   Morphine And Codeine Nausea And Vomiting    Social History:  reports that she has been smoking cigarettes. She has a 20 pack-year smoking history. She has never used smokeless tobacco. She reports current drug use. Drug: Marijuana. She reports  that she does not drink alcohol.  History reviewed. No pertinent family history.  The following portions of the patient's history were reviewed and updated as appropriate: allergies, current medications, past family history, past medical history, past social history, past surgical history and problem list.  Review of Systems Pertinent items noted in HPI and remainder of comprehensive ROS otherwise negative.  Physical Exam:  BP 118/78   Pulse 65   LMP  (LMP Unknown)  CONSTITUTIONAL: Well-developed, well-nourished female in no acute distress.  HENT:  Normocephalic, atraumatic, External right and left ear normal.  EYES: Conjunctivae and EOM are normal. Pupils are equal, round, and reactive to light. No scleral icterus.  NECK: Normal range of motion, supple, no masses.  Normal thyroid.  SKIN: Skin is warm and dry. No rash noted. Not diaphoretic. No erythema. No pallor. MUSCULOSKELETAL: Normal range of motion. No tenderness.  No cyanosis, clubbing, or edema. NEUROLOGIC: Alert and oriented to person, place, and time. Normal reflexes, muscle tone coordination.  PSYCHIATRIC: Normal mood and affect. Normal behavior. Normal judgment and thought content. CARDIOVASCULAR: Normal heart rate noted, regular rhythm RESPIRATORY: Clear to auscultation bilaterally. Effort and breath sounds normal, no problems with respiration noted. BREASTS: Symmetric in size. No masses, tenderness, skin changes, nipple drainage, or lymphadenopathy bilaterally. Performed in the presence of a chaperone. ABDOMEN: Soft, no distention noted.  No tenderness, rebound or guarding.  PELVIC: Normal appearing external genitalia and urethral meatus; mild vaginal atrophy noted with normal cervix.  No abnormal  vaginal discharge noted.  Pap smear obtained.  Normal uterine size, no other palpable masses, no uterine or adnexal tenderness.  Performed by the medical student in the presence of a chaperone, bimamual performed by Dr. Macon Large.    Assessment and Plan:     1. Breast cancer screening by mammogram Mammogram scheduled for breast cancer screening. We will follow up with the patient pending her results. - MM 3D SCREENING MAMMOGRAM BILATERAL BREAST; Future  2. Colon cancer screening Colon cancer screening: we discussed Cologuard.   Emphasized that positive Cologuard tests will need to be follow up with diagnostic colonoscopy. Cologuard ordered. - Cologuard  3. Routine screening for STI (sexually transmitted infection) We will follow up with the patient pending her results. - Cytology - PAP - RPR+HBsAg+HCVAb+...  4. Well woman exam with routine gynecological exam (Primary) We will follow up with the patient after her results come back. - Cytology - PAP - CBC - Comprehensive metabolic panel with GFR - Lipid panel - Hemoglobin A1c - TSH Rfx on Abnormal to Free T4  Routine preventative health maintenance measures emphasized. Please refer to After Visit Summary for other counseling recommendations.     Ophelia [facility] Medical Student   Attestation of Attending Supervision of Student:  I confirm that I have verified the information documented in the medical student's note and that I have also personally reperformed the history, physical exam and all medical decision making activities.  I have verified that all services and findings are accurately documented in this student's note; and I agree with management and plan as outlined in the documentation. I have also made any necessary editorial changes.   Jaynie Collins, MD, FACOG Attending Obstetrician & Gynecologist, [facility] for [employer], [facility]

## 2023-04-19 ENCOUNTER — Encounter: Payer: Self-pay | Admitting: Obstetrics & Gynecology

## 2023-04-19 LAB — CYTOLOGY - PAP
Chlamydia: NEGATIVE
Comment: NEGATIVE
Comment: NEGATIVE
Comment: NEGATIVE
Comment: NORMAL
Diagnosis: NEGATIVE
High risk HPV: NEGATIVE
Neisseria Gonorrhea: NEGATIVE
Trichomonas: NEGATIVE

## 2023-04-19 LAB — RPR+HBSAG+HCVAB+...
HIV Screen 4th Generation wRfx: NONREACTIVE
Hep C Virus Ab: NONREACTIVE
Hepatitis B Surface Ag: NEGATIVE
RPR Ser Ql: NONREACTIVE

## 2023-04-19 LAB — COMPREHENSIVE METABOLIC PANEL WITH GFR
ALT: 23 IU/L (ref 0–32)
AST: 16 IU/L (ref 0–40)
Albumin: 4.5 g/dL (ref 3.9–4.9)
Alkaline Phosphatase: 53 IU/L (ref 44–121)
BUN/Creatinine Ratio: 9 (ref 9–23)
BUN: 9 mg/dL (ref 6–24)
Bilirubin Total: 0.4 mg/dL (ref 0.0–1.2)
CO2: 23 mmol/L (ref 20–29)
Calcium: 9.5 mg/dL (ref 8.7–10.2)
Chloride: 100 mmol/L (ref 96–106)
Creatinine, Ser: 1.03 mg/dL — ABNORMAL HIGH (ref 0.57–1.00)
Globulin, Total: 2.3 g/dL (ref 1.5–4.5)
Glucose: 90 mg/dL (ref 70–99)
Potassium: 4.5 mmol/L (ref 3.5–5.2)
Sodium: 139 mmol/L (ref 134–144)
Total Protein: 6.8 g/dL (ref 6.0–8.5)
eGFR: 67 mL/min/{1.73_m2} (ref >59)

## 2023-04-19 LAB — LIPID PANEL
Chol/HDL Ratio: 3.6 ratio (ref 0.0–4.4)
Cholesterol, Total: 185 mg/dL (ref 100–199)
HDL: 51 mg/dL (ref >39)
LDL Chol Calc (NIH): 114 mg/dL — ABNORMAL HIGH (ref 0–99)
Triglycerides: 114 mg/dL (ref 0–149)
VLDL Cholesterol Cal: 20 mg/dL (ref 5–40)

## 2023-04-19 LAB — TSH RFX ON ABNORMAL TO FREE T4: TSH: 3.92 u[IU]/mL (ref 0.450–4.500)

## 2023-04-19 LAB — CBC
Hematocrit: 47.4 % — ABNORMAL HIGH (ref 34.0–46.6)
Hemoglobin: 15.5 g/dL (ref 11.1–15.9)
MCH: 30.7 pg (ref 26.6–33.0)
MCHC: 32.7 g/dL (ref 31.5–35.7)
MCV: 94 fL (ref 79–97)
Platelets: 358 10*3/uL (ref 150–450)
RBC: 5.05 x10E6/uL (ref 3.77–5.28)
RDW: 12.8 % (ref 11.7–15.4)
WBC: 10.4 10*3/uL (ref 3.4–10.8)

## 2023-04-19 LAB — HEMOGLOBIN A1C
Est. average glucose Bld gHb Est-mCnc: 120 mg/dL
Hgb A1c MFr Bld: 5.8 % — ABNORMAL HIGH (ref 4.8–5.6)

## 2023-05-02 ENCOUNTER — Encounter

## 2023-06-30 ENCOUNTER — Other Ambulatory Visit: Payer: Self-pay

## 2023-06-30 ENCOUNTER — Emergency Department

## 2023-06-30 ENCOUNTER — Emergency Department: Admission: EM | Admit: 2023-06-30 | Discharge: 2023-06-30 | Disposition: A | Discharge status: Home / Self Care

## 2023-06-30 DIAGNOSIS — Z720 Tobacco use: Secondary | ICD-10-CM | POA: Diagnosis not present

## 2023-06-30 DIAGNOSIS — R0602 Shortness of breath: Secondary | ICD-10-CM | POA: Insufficient documentation

## 2023-06-30 DIAGNOSIS — S43112A Subluxation of left acromioclavicular joint, initial encounter: Secondary | ICD-10-CM | POA: Insufficient documentation

## 2023-06-30 DIAGNOSIS — M2439 Pathological dislocation of other specified joint, not elsewhere classified: Secondary | ICD-10-CM

## 2023-06-30 DIAGNOSIS — X58XXXA Exposure to other specified factors, initial encounter: Secondary | ICD-10-CM | POA: Insufficient documentation

## 2023-06-30 DIAGNOSIS — D72829 Elevated white blood cell count, unspecified: Secondary | ICD-10-CM | POA: Insufficient documentation

## 2023-06-30 DIAGNOSIS — S4992XA Unspecified injury of left shoulder and upper arm, initial encounter: Secondary | ICD-10-CM | POA: Diagnosis present

## 2023-06-30 LAB — CBC
HCT: 48.3 % — ABNORMAL HIGH (ref 36.0–46.0)
Hemoglobin: 15.9 g/dL — ABNORMAL HIGH (ref 12.0–15.0)
MCH: 31.2 pg (ref 26.0–34.0)
MCHC: 32.9 g/dL (ref 30.0–36.0)
MCV: 94.7 fL (ref 80.0–100.0)
Platelets: 348 10*3/uL (ref 150–400)
RBC: 5.1 MIL/uL (ref 3.87–5.11)
RDW: 13.5 % (ref 11.5–15.5)
WBC: 16.6 10*3/uL — ABNORMAL HIGH (ref 4.0–10.5)
nRBC: 0 % (ref 0.0–0.2)

## 2023-06-30 LAB — BASIC METABOLIC PANEL WITH GFR
Anion gap: 12 (ref 5–15)
BUN: 11 mg/dL (ref 6–20)
CO2: 21 mmol/L — ABNORMAL LOW (ref 22–32)
Calcium: 9 mg/dL (ref 8.9–10.3)
Chloride: 102 mmol/L (ref 98–111)
Creatinine, Ser: 0.82 mg/dL (ref 0.44–1.00)
GFR, Estimated: >60 mL/min (ref >60)
Glucose, Bld: 107 mg/dL — ABNORMAL HIGH (ref 70–99)
Potassium: 4 mmol/L (ref 3.5–5.1)
Sodium: 135 mmol/L (ref 135–145)

## 2023-06-30 LAB — TROPONIN I (HIGH SENSITIVITY): Troponin I (High Sensitivity): 4 ng/L (ref <18)

## 2023-06-30 MED ORDER — IBUPROFEN 600 MG PO TABS
600.0000 mg | ORAL_TABLET | Freq: Once | ORAL | Status: AC
  Administered 2023-06-30: 600 mg via ORAL
  Filled 2023-06-30: qty 1

## 2023-06-30 MED ORDER — ACETAMINOPHEN 500 MG PO TABS
1000.0000 mg | ORAL_TABLET | Freq: Once | ORAL | Status: AC
  Administered 2023-06-30: 1000 mg via ORAL
  Filled 2023-06-30: qty 2

## 2023-06-30 MED ORDER — ACETAMINOPHEN 500 MG PO TABS: 1000.0000 mg | ORAL_TABLET | Freq: Four times a day (QID) | ORAL | 2 refills | Status: AC | PRN

## 2023-06-30 MED ORDER — IBUPROFEN 200 MG PO TABS: 600.0000 mg | ORAL_TABLET | Freq: Three times a day (TID) | ORAL | 2 refills | Status: AC | PRN

## 2023-06-30 NOTE — ED Triage Notes (Signed)
 Pt to ED via POV from home. Pt reports left shoulder pain with radiation to left arm. Pt reports pain is throbbing. Pt reports does a lot of kayaking. Reports some SOB.

## 2023-06-30 NOTE — Discharge Instructions (Addendum)
 An x-rays showed mild separation of the acromial joint in your shoulder.  This is treated with a shoulder sling, and I placed a referral for you to follow-up with an orthopedic provider--they will contact you to schedule an appointment.  You can use Tylenol  and Motrin  in the meantime for any ongoing discomfort.  Continue to use the sling as needed for any ongoing discomfort.  Return to the emergency department with any new or worsening symptoms.

## 2023-06-30 NOTE — ED Provider Notes (Signed)
 [facility] Provider Note    Event Date/Time   First MD Initiated Contact with Patient 06/30/23 1115     (approximate)   History   Shoulder Pain  Pt to ED via POV from home. Pt reports left shoulder pain with radiation to left arm. Pt reports pain is throbbing. Pt reports does a lot of kayaking. Reports some SOB.    HPI Julie Cooley is a 48 y.o. female PMH tobacco use presents for evaluation of left shoulder pain - Present for about 2-3 days, no clear preceding trauma.  Does frequently kayak.  - No fevers, no rash, no history of IVDU -No chest pain.  Contrary to triage note, denies shortness of breath.     Physical Exam   Triage Vital Signs: ED Triage Vitals [06/30/23 1009]  Encounter Vitals Group     BP 138/78     Girls Systolic BP Percentile      Girls Diastolic BP Percentile      Boys Systolic BP Percentile      Boys Diastolic BP Percentile      Pulse Rate 73     Resp 20     Temp 98.9 F (37.2 C)     Temp Source Oral     SpO2 98 %     Weight 157 lb (71.2 kg)     Height 5' 3 (1.6 m)     Head Circumference      Peak Flow      Pain Score 10     Pain Loc      Pain Education      Exclude from Growth Chart     Most recent vital signs: Vitals:   06/30/23 1009  BP: 138/78  Pulse: 73  Resp: 20  Temp: 98.9 F (37.2 C)  SpO2: 98%     General: Awake, no distress.  CV:  Good peripheral perfusion. RRR, RP 2+ Resp:  Normal effort. Abd:  No distention. Nontender to deep palpation throughout LUE:  Limited range of motion of shoulder due to pain though full range of motion of elbow and wrist.  RP 2+.  Radian/median/ulnar motor intact.  SI LT.  No overlying effusion or erythema or warmth or rash noted over left shoulder.   ED Results / Procedures / Treatments   Labs (all labs ordered are listed, but only abnormal results are displayed) Labs Reviewed  BASIC METABOLIC PANEL WITH GFR - Abnormal; Notable for the following components:       Result Value   CO2 21 (*)    Glucose, Bld 107 (*)    All other components within normal limits  CBC - Abnormal; Notable for the following components:   WBC 16.6 (*)    Hemoglobin 15.9 (*)    HCT 48.3 (*)    All other components within normal limits  TROPONIN I (HIGH SENSITIVITY)     EKG  Ecg = sinus rhythm, rate 73, no gross ST elevation or depression, no significant repolarization abnormality, normal axis, normal intervals.  No clear evidence of ischemia nor arrhythmia on my interpretation.   RADIOLOGY X-ray left shoulder reviewed, no fracture or dislocation on my read.  AC joint subluxation noted by radiology.    PROCEDURES:  Critical Care performed: No  Procedures   MEDICATIONS ORDERED IN ED: Medications  acetaminophen  (TYLENOL ) tablet 1,000 mg (has no administration in time range)  ibuprofen  (ADVIL ) tablet 600 mg (has no administration in time range)     IMPRESSION /  MDM / ASSESSMENT AND PLAN / ED COURSE  I reviewed the triage vital signs and the nursing notes.                              DDX/MDM/AP: Differential diagnosis includes, but is not limited to, shoulder sprain, doubt fracture or dislocation.  Do not suspect cardiac etiology at this time, do not suspect PE, pneumothorax.  Screening labs, left shoulder x-ray, EKG performed at triage-overall reassuring though notable for left AC joint subluxation.  No fracture or dislocation.  I do believe this is etiology of presentation with no other clear pathology today.  Plan: - Tylenol , Motrin  - Shoulder sling - Orthopedic referral  Patient's presentation is most consistent with acute complicated illness / injury requiring diagnostic workup.    ED course below.   Clinical Course as of 06/30/23 1206  Thu Jun 30, 2023  1132 Trop wnl [MM]  1150 CBC with leukocytosis, no anemia BMP reviewed, unremarkable [MM]  1150 XR left shoulder with no fracture or dislocation on my read, radiology report  below  IMPRESSION: Grade 1 AC joint subluxation   [MM]    Clinical Course User Index [MM] Collis Deaner, MD     FINAL CLINICAL IMPRESSION(S) / ED DIAGNOSES   Final diagnoses:  Nontraumatic subluxation of acromioclavicular joint     Rx / DC Orders   ED Discharge Orders          Ordered    acetaminophen  (TYLENOL ) 500 MG tablet  Every 6 hours PRN        06/30/23 1204    ibuprofen  (MOTRIN  IB) 200 MG tablet  Every 8 hours PRN        06/30/23 1204    Ambulatory referral to Orthopedic Surgery       Comments: L AC joint subluxation   06/30/23 1205             Note:  This document was prepared using Dragon voice recognition software and may include unintentional dictation errors.   Collis Deaner, MD 06/30/23 [phone]

## 2024-04-23 ENCOUNTER — Ambulatory Visit: Admitting: Obstetrics & Gynecology
